# Patient Record
Sex: Male | Born: 1995 | Race: Black or African American | Hispanic: No | Marital: Single | Smoking: Former smoker
Health system: Southern US, Community
[De-identification: ages and names within clinical notes are randomized; demographics above are authoritative.]

## PROBLEM LIST (undated history)

## (undated) DIAGNOSIS — R011 Cardiac murmur, unspecified: Secondary | ICD-10-CM

## (undated) DIAGNOSIS — J40 Bronchitis, not specified as acute or chronic: Secondary | ICD-10-CM

## (undated) DIAGNOSIS — H5350 Unspecified color vision deficiencies: Secondary | ICD-10-CM

## (undated) DIAGNOSIS — J45909 Unspecified asthma, uncomplicated: Secondary | ICD-10-CM

---
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
## 2016-06-16 ENCOUNTER — Emergency Department (HOSPITAL_COMMUNITY)
Admission: EM | Admit: 2016-06-16 | Discharge: 2016-06-16 | Disposition: A | Discharge status: Home / Self Care | Payer: Medicaid Other | Attending: Emergency Medicine | Admitting: Emergency Medicine

## 2016-06-16 ENCOUNTER — Encounter (HOSPITAL_COMMUNITY): Payer: Self-pay | Admitting: Emergency Medicine

## 2016-06-16 DIAGNOSIS — J45909 Unspecified asthma, uncomplicated: Secondary | ICD-10-CM | POA: Insufficient documentation

## 2016-06-16 DIAGNOSIS — Z79899 Other long term (current) drug therapy: Secondary | ICD-10-CM | POA: Insufficient documentation

## 2016-06-16 DIAGNOSIS — Y929 Unspecified place or not applicable: Secondary | ICD-10-CM | POA: Insufficient documentation

## 2016-06-16 DIAGNOSIS — X500XXA Overexertion from strenuous movement or load, initial encounter: Secondary | ICD-10-CM | POA: Insufficient documentation

## 2016-06-16 DIAGNOSIS — Y939 Activity, unspecified: Secondary | ICD-10-CM | POA: Diagnosis not present

## 2016-06-16 DIAGNOSIS — M545 Low back pain, unspecified: Secondary | ICD-10-CM

## 2016-06-16 DIAGNOSIS — Y990 Civilian activity done for income or pay: Secondary | ICD-10-CM | POA: Insufficient documentation

## 2016-06-16 DIAGNOSIS — F17200 Nicotine dependence, unspecified, uncomplicated: Secondary | ICD-10-CM | POA: Diagnosis not present

## 2016-06-16 NOTE — Discharge Instructions (Signed)

## 2016-06-16 NOTE — ED Provider Notes (Signed)
[facility]-EMERGENCY DEPT Provider Note   CSN: [id number] Arrival date & time: 06/16/16  1307   By signing my name below, I, Allen Jensen, attest that this documentation has been prepared under the direction and in the presence of [facility], [place]. Electronically Signed: Teofilo Jensen, ED Scribe. 06/16/2016. 3:30 PM.   History   Chief Complaint Chief Complaint  Patient presents with  . Back Pain    The history is provided by the patient. No language interpreter was used.   HPI Comments:  Allen Jensen is a 21 y.o. male who presents to the Emergency Department complaining of constant left sided low back pain x 6 days. He states that the pain is dull at rest and is "sharp, stabbing and burning" at times when moving or changing positions. He rates the pain at 8/10, and states that the paid radiates up his spine to the mid back and left flank, and down to the left knee. Pt complains of associated intermittent left anterior thigh numbness. He states his pain began after lifting a light box overhead. 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 Pt reports a previous back injury 6 months ago, and states that his current pain is worse than when he was injured. He has taken hot showers with some relief. Denies bowel/bladder incontinence, weakness, nausea, vomiting, abdominal pain, SOB dysuria, hematuria.   PCP per pt: Dr. Jamal Collin; [address]   Past Medical History:  Diagnosis Date  . Asthma   . Bronchitis   . Color blind   . Heart murmur     There are no active problems to display for this patient.   History reviewed. No pertinent surgical history.     Home Medications    Prior to Admission medications   Medication Sig Start Date End Date Taking? Authorizing Provider  HYDROcodone-acetaminophen (NORCO) 5-325 MG tablet Take 1 tablet by mouth every 6 (six) hours as needed. 11/05/15   Kirichenko, Tatyana, PA-C  ibuprofen (ADVIL,MOTRIN) 600  MG tablet Take 1 tablet (600 mg total) by mouth every 6 (six) hours as needed. 11/05/15   Kirichenko, Tatyana, PA-C  predniSONE (DELTASONE) 20 MG tablet Take 2 tablets (40 mg total) by mouth daily. Patient not taking: Reported on 07/09/2015 03/29/15   Anselm Pancoast, PA-C  ranitidine (ZANTAC) 150 MG tablet Take 1 tablet (150 mg total) by mouth 2 (two) times daily. Patient taking differently: Take 150 mg by mouth daily.  07/09/15   Barrett Henle, PA-C    Family History No family history on file.  Social History Social History  Substance Use Topics  . Smoking status: Current Some Day Smoker  . Smokeless tobacco: Never Used  . Alcohol use No     Allergies   Other   Review of Systems Review of Systems  Respiratory: Negative for shortness of breath.   Cardiovascular: Negative for chest pain.  Gastrointestinal: Negative for abdominal pain, nausea and vomiting.  Genitourinary: Negative for dysuria and hematuria.  Musculoskeletal: Positive for back pain.  Neurological: Positive for numbness. Negative for weakness.     Physical Exam Updated Vital Signs BP 125/65 (BP Location: Left Arm)   Pulse (!) 55   Temp 98.3 F (36.8 C) (Oral)   Resp 16   Ht 6\' 2"  (1.88 m)   Wt 68 kg   SpO2 100%   BMI 19.26 kg/m   Physical Exam  Constitutional: He is oriented to person, place, and time.  He appears well-developed and well-nourished. No distress.  HENT:  Head: Normocephalic and atraumatic.  Eyes: Conjunctivae are normal. Pupils are equal, round, and reactive to light. Right eye exhibits no discharge. Left eye exhibits no discharge. No scleral icterus.  Neck: No JVD present. No tracheal deviation present.  Cardiovascular: Normal rate and intact distal pulses.   2+ radial and DP/PT pulses bl, negative Homan's bl   Pulmonary/Chest: Effort normal.  Abdominal: He exhibits no distension.  Musculoskeletal: Normal range of motion. He exhibits tenderness.  No midline spine TTP. Left  sided lumbar paraspinal muscle tenderness. No deformity, crepitus, or stepoff noted. Normal ROM of BUE and BLE. 5/5 strength of BUE and BLE. Pain with flexion of lumbar spine, not with extension or lateral rotation. Left SIJ restricted.    Neurological: He is alert and oriented to person, place, and time. No cranial nerve deficit or sensory deficit.  Fluent speech, no facial droop, sensation intact globally, normal gait, and patient able to heel walk and toe walk without difficulty.   Skin: Skin is warm and dry. Capillary refill takes less than 2 seconds.  Psychiatric: He has a normal mood and affect. His behavior is normal.  Nursing note and vitals reviewed.    ED Treatments / Results  DIAGNOSTIC STUDIES:  Oxygen Saturation is 98% on RA, normal by my interpretation.    COORDINATION OF CARE:  3:09 PM Discussed treatment plan with pt at bedside and pt agreed to plan.   Labs (all labs ordered are listed, but only abnormal results are displayed) Labs Reviewed - No data to display  EKG  EKG Interpretation None       Radiology No results found.  Procedures Procedures (including critical care time)  Medications Ordered in ED Medications - No data to display   Initial Impression / Assessment and Plan / ED Course  I have reviewed the triage vital signs and the nursing notes.  Pertinent labs & imaging results that were available during my care of the patient were reviewed by me and considered in my medical decision making (see chart for details).     Patient with left-sided low back pain for 6 days. Afebrile, vital signs are stable. No red flags concerning patient's back pain. No s/s of central cord compression or cauda equina. Lower extremities are neurovascularly intact and patient is ambulating without difficulty. Likely myofascial in nature with some component of SI joint dysfunction. Discussed use of ibuprofen, Tylenol, heat, ice, gentle stretching to the affected areas.  Recommend follow-up with primary care for reevaluation. Discussed strict ED return precautions. Pt verbalized understanding of and agreement with plan and is safe for discharge home at this time.    Final Clinical Impressions(s) / ED Diagnoses   Final diagnoses:  Acute left-sided low back pain without sciatica    New Prescriptions Discharge Medication List as of 06/16/2016  3:39 PM    I personally performed the services described in this documentation, which was scribed in my presence. The recorded information has been reviewed and is accurate.     Jeanie SewerFawze, Haeley Fordham A, PA-C 06/18/16 [id number]    Jerelyn ScottLinker, Martha, MD 06/18/16 [phone]

## 2016-06-16 NOTE — ED Notes (Signed)
PT DISCHARGED. INSTRUCTIONS GIVEN. AAOX4. PT IN NO APPARENT DISTRESS OR PAIN. THE OPPORTUNITY TO ASK QUESTIONS WAS PROVIDED. 

## 2016-06-16 NOTE — ED Triage Notes (Signed)
Pt complaint of lower back pain for past week post lifting at work.

## 2017-03-04 ENCOUNTER — Emergency Department (HOSPITAL_COMMUNITY)
Admission: EM | Admit: 2017-03-04 | Discharge: 2017-03-04 | Disposition: A | Discharge status: Home / Self Care | Payer: Medicaid Other | Attending: Emergency Medicine | Admitting: Emergency Medicine

## 2017-03-04 ENCOUNTER — Encounter (HOSPITAL_COMMUNITY): Payer: Self-pay | Admitting: Emergency Medicine

## 2017-03-04 DIAGNOSIS — J020 Streptococcal pharyngitis: Secondary | ICD-10-CM | POA: Diagnosis not present

## 2017-03-04 DIAGNOSIS — J029 Acute pharyngitis, unspecified: Secondary | ICD-10-CM | POA: Diagnosis present

## 2017-03-04 DIAGNOSIS — R0981 Nasal congestion: Secondary | ICD-10-CM | POA: Insufficient documentation

## 2017-03-04 DIAGNOSIS — F17210 Nicotine dependence, cigarettes, uncomplicated: Secondary | ICD-10-CM | POA: Insufficient documentation

## 2017-03-04 DIAGNOSIS — R590 Localized enlarged lymph nodes: Secondary | ICD-10-CM | POA: Insufficient documentation

## 2017-03-04 LAB — MONONUCLEOSIS SCREEN: Mono Screen: NEGATIVE

## 2017-03-04 LAB — RAPID STREP SCREEN (MED CTR MEBANE ONLY): Streptococcus, Group A Screen (Direct): POSITIVE — AB

## 2017-03-04 MED ORDER — DEXAMETHASONE 10 MG/ML FOR PEDIATRIC ORAL USE
10.0000 mg | Freq: Once | INTRAMUSCULAR | Status: AC
  Administered 2017-03-04: 10 mg via ORAL
  Filled 2017-03-04: qty 1

## 2017-03-04 MED ORDER — PENICILLIN G BENZATHINE 1200000 UNIT/2ML IM SUSP
1.2000 10*6.[IU] | Freq: Once | INTRAMUSCULAR | Status: AC
  Administered 2017-03-04: 1.2 10*6.[IU] via INTRAMUSCULAR
  Filled 2017-03-04: qty 2

## 2017-03-04 MED ORDER — LIDOCAINE VISCOUS 2 % MT SOLN: 20.0000 mL | OROMUCOSAL | 0 refills | Status: DC | PRN

## 2017-03-04 MED ORDER — IBUPROFEN 200 MG PO TABS
600.0000 mg | ORAL_TABLET | Freq: Once | ORAL | Status: AC
  Administered 2017-03-04: 600 mg via ORAL
  Filled 2017-03-04: qty 3

## 2017-03-04 NOTE — Discharge Instructions (Signed)
Your strep test was positive.  Have been treated with antibiotics in the ED.  I would continue taking Motrin and Tylenol around-the-clock to help with swelling and pain.  Have also given you a lidocaine mouthwash to help numb your throat.  Follow-up with your primary care doctor or return to the ED if you develop difficulties breathing, difficulty swallowing, worsening symptoms.

## 2017-03-04 NOTE — ED Triage Notes (Signed)
Patient c/o sore throat, ear fullness, and congestion for week or more. Patient reports everyone around him is sick.

## 2017-03-04 NOTE — ED Provider Notes (Signed)
[facility]-EMERGENCY DEPT Provider Note   CSN: [id number] Arrival date & time: 03/04/17  0705     History   Chief Complaint Chief Complaint  Patient presents with  . Sore Throat  . Ear Fullness  . Nasal Congestion    HPI Allen Jensen is a 22 y.o. male.  HPI 22 year old African-American male past medical history significant for color blindness presents to the emergency department today for evaluation of sore throat, ear fullness, congestion.  Patient reports that his symptoms have been ongoing for 1 week.  Patient reports fevers and chills.  Has been trying several over-the-counter medications for his symptoms.  States that there are many sick contacts.  States that it is hard to swallow his spit due to the swelling and pain in his throat.  Patient has not taking for his symptoms prior to arrival today.  Patient reports right greater than left ear fullness.  Denies any associated ear pain.  Patient reports clear rhinorrhea.  Denies any associated productive cough.  Nothing makes his symptoms better or worse. Past Medical History:  Diagnosis Date  . Asthma   . Bronchitis   . Color blind   . Heart murmur     There are no active problems to display for this patient.   History reviewed. No pertinent surgical history.     Home Medications    Prior to Admission medications   Medication Sig Start Date End Date Taking? Authorizing Provider  HYDROcodone-acetaminophen (NORCO) 5-325 MG tablet Take 1 tablet by mouth every 6 (six) hours as needed. 11/05/15   Kirichenko, Tatyana, PA-C  ibuprofen (ADVIL,MOTRIN) 600 MG tablet Take 1 tablet (600 mg total) by mouth every 6 (six) hours as needed. 11/05/15   Kirichenko, Tatyana, PA-C  predniSONE (DELTASONE) 20 MG tablet Take 2 tablets (40 mg total) by mouth daily. Patient not taking: Reported on 07/09/2015 03/29/15   Anselm Pancoast, PA-C  ranitidine (ZANTAC) 150 MG tablet Take 1 tablet (150 mg total) by mouth 2 (two) times  daily. Patient taking differently: Take 150 mg by mouth daily.  07/09/15   Barrett Henle, PA-C    Family History No family history on file.  Social History Social History   Tobacco Use  . Smoking status: Current Some Day Smoker  . Smokeless tobacco: Never Used  Substance Use Topics  . Alcohol use: No  . Drug use: No     Allergies   Other   Review of Systems Review of Systems  Constitutional: Positive for chills and fever.  HENT: Positive for congestion, ear pain, rhinorrhea, sore throat and trouble swallowing. Negative for ear discharge.   Respiratory: Negative for cough.   Gastrointestinal: Negative for vomiting.  Musculoskeletal: Negative for myalgias.  Skin: Negative for rash.  Neurological: Negative for headaches.     Physical Exam Updated Vital Signs BP (!) 144/84 (BP Location: Left Arm)   Pulse 92   Temp 99.1 F (37.3 C) (Oral)   Resp 18   SpO2 100%   Physical Exam  Constitutional: He appears well-developed and well-nourished. No distress.  HENT:  Head: Normocephalic and atraumatic.  Right Ear: External ear normal.  Left Ear: Hearing, tympanic membrane, external ear and ear canal normal.  Nose: Mucosal edema and rhinorrhea present. Right sinus exhibits no maxillary sinus tenderness and no frontal sinus tenderness. Left sinus exhibits no maxillary sinus tenderness and no frontal sinus tenderness.  Mouth/Throat: Uvula is midline and mucous membranes are normal. No trismus in the jaw.  No uvula swelling. Oropharyngeal exudate, posterior oropharyngeal edema and posterior oropharyngeal erythema present. No tonsillar abscesses. Tonsils are 3+ on the right. Tonsils are 3+ on the left. Tonsillar exudate.  Cerumen in right ear however portion of the TM was visualized was unremarkable.  No mastoid tenderness.  Tonsils are enlarged and equal bilaterally.  Uvula is midline.  Patient does have a muffled voice.  Eyes: Right eye exhibits no discharge. Left eye  exhibits no discharge. No scleral icterus.  Neck: Normal range of motion. Neck supple.  Pulmonary/Chest: Effort normal. No respiratory distress.  Musculoskeletal: Normal range of motion.  Lymphadenopathy:    He has cervical adenopathy.  Neurological: He is alert.  Skin: Skin is warm and dry. Capillary refill takes less than 2 seconds. No pallor.  Psychiatric: His behavior is normal. Judgment and thought content normal.  Nursing note and vitals reviewed.    ED Treatments / Results  Labs (all labs ordered are listed, but only abnormal results are displayed) Labs Reviewed  RAPID STREP SCREEN (NOT AT [facility])  MONONUCLEOSIS SCREEN    EKG  EKG Interpretation None       Radiology No results found.  Procedures Procedures (including critical care time)  Medications Ordered in ED Medications - No data to display   Initial Impression / Assessment and Plan / ED Course  I have reviewed the triage vital signs and the nursing notes.  Pertinent labs & imaging results that were available during my care of the patient were reviewed by me and considered in my medical decision making (see chart for details).     Pt febrile with tonsillar exudate, cervical lymphadenopathy, & dysphagia; diagnosis of strep with positive strep test.  I did order a mono screen given the appearance of tonsils however low likely given positive strep.. Treated in the Ed with steroids, NSAIDs, Pain medication and PCN IM.  Pt appears mildly dehydrated, discussed importance of water rehydration. Presentation non concerning for PTA or infxn spread to soft tissue. No trismus or uvula deviation. Specific return precautions discussed. Pt able to drink water in ED without difficulty with intact air way. Recommended PCP follow up.    Final Clinical Impressions(s) / ED Diagnoses   Final diagnoses:  Strep pharyngitis    ED Discharge Orders    None       Wallace KellerLeaphart, Keavon Sensing T, PA-C 03/04/17 [id number]    Alvira MondaySchlossman,  Erin, MD 03/04/17 [phone]

## 2017-04-07 ENCOUNTER — Encounter (HOSPITAL_COMMUNITY): Payer: Self-pay | Admitting: Emergency Medicine

## 2017-04-07 ENCOUNTER — Emergency Department (HOSPITAL_COMMUNITY)
Admission: EM | Admit: 2017-04-07 | Discharge: 2017-04-07 | Disposition: A | Discharge status: Home / Self Care | Payer: Medicaid Other | Attending: Emergency Medicine | Admitting: Emergency Medicine

## 2017-04-07 DIAGNOSIS — Z79899 Other long term (current) drug therapy: Secondary | ICD-10-CM | POA: Diagnosis not present

## 2017-04-07 DIAGNOSIS — J45909 Unspecified asthma, uncomplicated: Secondary | ICD-10-CM | POA: Diagnosis not present

## 2017-04-07 DIAGNOSIS — H9202 Otalgia, left ear: Secondary | ICD-10-CM | POA: Diagnosis not present

## 2017-04-07 DIAGNOSIS — J351 Hypertrophy of tonsils: Secondary | ICD-10-CM | POA: Diagnosis not present

## 2017-04-07 DIAGNOSIS — J029 Acute pharyngitis, unspecified: Secondary | ICD-10-CM | POA: Insufficient documentation

## 2017-04-07 DIAGNOSIS — F17200 Nicotine dependence, unspecified, uncomplicated: Secondary | ICD-10-CM | POA: Insufficient documentation

## 2017-04-07 DIAGNOSIS — R070 Pain in throat: Secondary | ICD-10-CM | POA: Diagnosis present

## 2017-04-07 LAB — I-STAT CHEM 8, ED
BUN: 11 mg/dL (ref 6–20)
Calcium, Ion: 1.23 mmol/L (ref 1.15–1.40)
Chloride: 99 mmol/L — ABNORMAL LOW (ref 101–111)
Creatinine, Ser: 0.9 mg/dL (ref 0.61–1.24)
Glucose, Bld: 89 mg/dL (ref 65–99)
HEMATOCRIT: 42 % (ref 39.0–52.0)
HEMOGLOBIN: 14.3 g/dL (ref 13.0–17.0)
POTASSIUM: 3.4 mmol/L — AB (ref 3.5–5.1)
SODIUM: 143 mmol/L (ref 135–145)
TCO2: 25 mmol/L (ref 22–32)

## 2017-04-07 LAB — MONONUCLEOSIS SCREEN: Mono Screen: NEGATIVE

## 2017-04-07 LAB — RAPID STREP SCREEN (MED CTR MEBANE ONLY): STREPTOCOCCUS, GROUP A SCREEN (DIRECT): NEGATIVE

## 2017-04-07 MED ORDER — CLINDAMYCIN HCL 300 MG PO CAPS: 300.0000 mg | ORAL_CAPSULE | Freq: Three times a day (TID) | ORAL | 0 refills | Status: AC

## 2017-04-07 MED ORDER — KETOROLAC TROMETHAMINE 30 MG/ML IJ SOLN
30.0000 mg | Freq: Once | INTRAMUSCULAR | Status: AC
  Administered 2017-04-07: 30 mg via INTRAVENOUS
  Filled 2017-04-07: qty 1

## 2017-04-07 MED ORDER — IBUPROFEN 600 MG PO TABS: 600.0000 mg | ORAL_TABLET | Freq: Four times a day (QID) | ORAL | 0 refills | Status: DC | PRN

## 2017-04-07 MED ORDER — CLINDAMYCIN PHOSPHATE 600 MG/50ML IV SOLN
600.0000 mg | Freq: Once | INTRAVENOUS | Status: AC
  Administered 2017-04-07: 600 mg via INTRAVENOUS
  Filled 2017-04-07: qty 50

## 2017-04-07 MED ORDER — DEXAMETHASONE SODIUM PHOSPHATE 10 MG/ML IJ SOLN
10.0000 mg | Freq: Once | INTRAMUSCULAR | Status: AC
  Administered 2017-04-07: 10 mg via INTRAVENOUS
  Filled 2017-04-07: qty 1

## 2017-04-07 MED ORDER — SODIUM CHLORIDE 0.9 % IV BOLUS (SEPSIS)
1000.0000 mL | Freq: Once | INTRAVENOUS | Status: AC
  Administered 2017-04-07: 1000 mL via INTRAVENOUS

## 2017-04-07 NOTE — ED Triage Notes (Signed)
Patient c/o bilat ear pain and sore throat for 3 days. Been taking ibuprofen but not helping for pain anymore and had trouble swallowing pill this morning.

## 2017-04-07 NOTE — ED Notes (Signed)
Ear pain, throat pain

## 2017-04-07 NOTE — ED Notes (Signed)
Report given to RN

## 2017-04-07 NOTE — ED Provider Notes (Signed)
[facility]-EMERGENCY DEPT Provider Note   CSN: [id number] Arrival date & time: 04/07/17  0903     History   Chief Complaint Chief Complaint  Patient presents with  . Sore Throat  . Otalgia    HPI Allen Jensen is a 22 y.o. male.  HPI   Patient is a 22 y.o. male with a history of asthma (as a child), heart murmur, presenting for sore throat.  Patient reports that he has had symptoms for 2 days.  Patient reports subjective chills and rigors.  Patient reports he has had difficulty swallowing due to the pain.  Patient reports he has subjective swelling greater on the left side of the throat than the right.  Patient denies any sublingual induration, neck tenderness, difficulty breathing, stridor, or inability to tolerate his own secretions.  Patient also reports left ear pain without drainage.  Patient reports feeling of fullness in the left ear.  Patient reports he has been taking ibuprofen without full relief.  Patient denies any history of peritonsillar abscess.  Past Medical History:  Diagnosis Date  . Asthma   . Bronchitis   . Color blind   . Heart murmur     There are no active problems to display for this patient.   History reviewed. No pertinent surgical history.     Home Medications    Prior to Admission medications   Medication Sig Start Date End Date Taking? Authorizing Provider  ibuprofen (ADVIL,MOTRIN) 200 MG tablet Take 200 mg by mouth every 2 (two) hours as needed (THROAT PAIN).   Yes [provider]  lidocaine (XYLOCAINE) 2 % solution Use as directed 20 mLs in the mouth or throat as needed for mouth pain. 03/04/17  Yes Leaphart, Lynann Beaver, PA-C  HYDROcodone-acetaminophen (NORCO) 5-325 MG tablet Take 1 tablet by mouth every 6 (six) hours as needed. Patient not taking: Reported on 04/07/2017 11/05/15   Jaynie Crumble, PA-C  ibuprofen (ADVIL,MOTRIN) 600 MG tablet Take 1 tablet (600 mg total) by mouth every 6 (six) hours as  needed. Patient not taking: Reported on 04/07/2017 11/05/15   Jaynie Crumble, PA-C  predniSONE (DELTASONE) 20 MG tablet Take 2 tablets (40 mg total) by mouth daily. Patient not taking: Reported on 04/07/2017 03/29/15   Anselm Pancoast, PA-C  ranitidine (ZANTAC) 150 MG tablet Take 1 tablet (150 mg total) by mouth 2 (two) times daily. Patient not taking: Reported on 04/07/2017 07/09/15   Barrett Henle, PA-C    Family History No family history on file.  Social History Social History   Tobacco Use  . Smoking status: Current Some Day Smoker  . Smokeless tobacco: Never Used  Substance Use Topics  . Alcohol use: No  . Drug use: No     Allergies   Other   Review of Systems Review of Systems  Constitutional: Positive for chills. Negative for fever.  HENT: Positive for congestion, ear pain and sore throat. Negative for ear discharge and rhinorrhea.   Respiratory: Positive for cough. Negative for shortness of breath.   Gastrointestinal: Negative for abdominal pain, nausea and vomiting.  Genitourinary: Negative for dysuria.  Skin: Negative for rash.     Physical Exam Updated Vital Signs BP 129/80   Pulse 85   Temp 99.2 F (37.3 C) (Oral)   Resp 16   Ht 6\' 1"  (1.854 m)   Wt 72.6 kg (160 lb)   SpO2 99%   BMI 21.11 kg/m   Physical Exam  Constitutional: He appears  well-developed and well-nourished. No distress.  Sitting comfortably in bed.  HENT:  Head: Normocephalic and atraumatic.  Mouth/Throat: Uvula is midline. Tonsils are 3+ on the right. Tonsils are 3+ on the left.  Normal phonation. No muffled voice sounds. Patient swallows secretions without difficulty. Dentition normal. No lesions of tongue or buccal mucosa. Uvula midline. Tonsils are 3+ bilaterally. Slight increased swelling of left tonsil compared ot right.Erythema of posterior pharynx. No tonsillar exuduate. No lingual swelling. No induration inferior to tongue. No submandibular tenderness, swelling, or  induration.  Tissues of the neck supple. Posterior cervical lymphadenopathy. TMs is difficult to visualize due to cerumen impaction.  Eyes: Conjunctivae and EOM are normal. Pupils are equal, round, and reactive to light. Right eye exhibits no discharge. Left eye exhibits no discharge.  EOMs normal to gross examination.  Neck: Normal range of motion. Neck supple.  Cardiovascular: Normal rate, regular rhythm, S1 normal and S2 normal.  No murmur heard. Pulmonary/Chest: Effort normal and breath sounds normal. He has no wheezes. He has no rales.  Normal respiratory effort. Patient converses comfortably. No audible wheeze or stridor.  Abdominal: Soft. He exhibits no distension. There is no tenderness. There is no guarding.  Musculoskeletal: Normal range of motion. He exhibits no edema or deformity.  Lymphadenopathy:    He has no cervical adenopathy.  Neurological: He is alert.  Cranial nerves intact to gross observation. Patient moves extremities without difficulty.  Skin: Skin is warm and dry. No rash noted. He is not diaphoretic. No erythema.  Psychiatric: He has a normal mood and affect. His behavior is normal. Judgment and thought content normal.  Nursing note and vitals reviewed.    ED Treatments / Results  Labs (all labs ordered are listed, but only abnormal results are displayed) Labs Reviewed  RAPID STREP SCREEN (NOT AT [facility])  CULTURE, GROUP A STREP [facility])  MONONUCLEOSIS SCREEN  I-STAT CHEM 8, ED    EKG  EKG Interpretation None       Radiology No results found.  Procedures Procedures (including critical care time)  Medications Ordered in ED Medications  sodium chloride 0.9 % bolus 1,000 mL (not administered)  clindamycin (CLEOCIN) IVPB 600 mg (not administered)  dexamethasone (DECADRON) injection 10 mg (not administered)  ketorolac (TORADOL) 30 MG/ML injection 30 mg (not administered)     Initial Impression / Assessment and Plan / ED Course  I have reviewed  the triage vital signs and the nursing notes.  Pertinent labs & imaging results that were available during my care of the patient were reviewed by me and considered in my medical decision making (see chart for details).  Clinical Course as of Apr 07 1516  Tue Apr 07, 2017  1440 On reevaluation, patient continued to handle secretions and requesting discharge.  [AM]    Clinical Course User Index [AM] Elisha PonderMurray, Katieann Hungate B, PA-C    Patient is nontoxic-appearing, handling his own secretions, and tolerating p.o.  There is slight fullness of the left tonsil and swelling of the anterior tonsillar pillar.  Concern is for early peritonsillar abscess.  Patient has a negative strep test.  Negative Monospot test.  Will initiate IV clindamycin, fluid bolus, Decadron, and Toradol.  Will discuss case with ENT to establish close follow-up for patient.  Pain improved with Toradol and Decadron on multiple reevaluations.  Patient tolerating p.o. challenge, and tolerating solids in the emergency department.  3:26 PM Spoke with Dr. Jenne PaneBates of ENT, who recommends close follow up within 3 days. Environmental health practitionerAdministrative assistant schedule  appt for 3pm on Friday, 04/10/17.  Patient given return precautions for any interim swelling of the tonsils, uvula deviation, difficulty breathing, difficulty swallowing, stridor, or change in voice quality.  Patient is in understanding and agrees with the plan of care.  This is a shared visit with Ankit Nanvati. Patient was independently evaluated by this attending physician. Attending physician consulted in evaluation and discharge management.   Final Clinical Impressions(s) / ED Diagnoses   Final diagnoses:  Sore throat  Swelling of tonsil    ED Discharge Orders    None       Delia Chimes 04/07/17 1658    Derwood Kaplan, MD 04/08/17 [phone]

## 2017-04-07 NOTE — Discharge Instructions (Signed)
Please read and follow all provided instructions.  Your diagnoses today include:  1. Sore throat   2. Swelling of tonsil    Your exam shows that she may have an early fluid pocket behind your tonsil.  You will need to take the antibiotic we gave you, and follow-up with Dr. Jenne PaneBates of ENT.  Tests performed today include: Strep test: was negative for strep throat Strep culture: you will be notified if this comes back positive Vital signs. See below for your results today.   Medications prescribed:    Home care instructions:  Please read the educational materials provided and follow any instructions contained in this packet.  Follow-up instructions: Please follow-up with your primary care provider as needed for further evaluation of your symptoms.  Return instructions:  Please return to the Emergency Department if you experience worsening symptoms.  Return if you have worsening problems swallowing, your neck becomes swollen, you cannot swallow your saliva or your voice becomes muffled.  Return with high persistent fever, persistent vomiting, or if you have trouble breathing.  Please return if you have any other emergent concerns.  Additional Information:  Your vital signs today were: BP 122/66 (BP Location: Left Arm)    Pulse 80    Temp 99.5 F (37.5 C) (Oral)    Resp 15    Ht 6\' 1"  (1.854 m)    Wt 72.6 kg (160 lb)    SpO2 99%    BMI 21.11 kg/m  If your blood pressure (BP) was elevated above 135/85 this visit, please have this repeated by your doctor within one month. --------------

## 2017-04-09 LAB — CULTURE, GROUP A STREP (THRC)

## 2017-05-01 ENCOUNTER — Other Ambulatory Visit: Payer: Self-pay

## 2017-05-01 ENCOUNTER — Emergency Department (HOSPITAL_COMMUNITY)
Admission: EM | Admit: 2017-05-01 | Discharge: 2017-05-01 | Disposition: A | Discharge status: Home / Self Care | Payer: Medicaid Other | Attending: Emergency Medicine | Admitting: Emergency Medicine

## 2017-05-01 ENCOUNTER — Encounter (HOSPITAL_COMMUNITY): Payer: Self-pay

## 2017-05-01 DIAGNOSIS — J0391 Acute recurrent tonsillitis, unspecified: Secondary | ICD-10-CM | POA: Insufficient documentation

## 2017-05-01 DIAGNOSIS — H9202 Otalgia, left ear: Secondary | ICD-10-CM | POA: Diagnosis present

## 2017-05-01 DIAGNOSIS — J039 Acute tonsillitis, unspecified: Secondary | ICD-10-CM

## 2017-05-01 DIAGNOSIS — R590 Localized enlarged lymph nodes: Secondary | ICD-10-CM | POA: Insufficient documentation

## 2017-05-01 DIAGNOSIS — F17210 Nicotine dependence, cigarettes, uncomplicated: Secondary | ICD-10-CM | POA: Diagnosis not present

## 2017-05-01 MED ORDER — DEXAMETHASONE 4 MG PO TABS
10.0000 mg | ORAL_TABLET | Freq: Once | ORAL | Status: AC
  Administered 2017-05-01: 15:00:00 10 mg via ORAL
  Filled 2017-05-01: qty 2

## 2017-05-01 MED ORDER — CLINDAMYCIN HCL 300 MG PO CAPS: 300.0000 mg | ORAL_CAPSULE | Freq: Three times a day (TID) | ORAL | 0 refills | Status: DC

## 2017-05-01 MED ORDER — PREDNISONE 10 MG (21) PO TBPK: ORAL_TABLET | ORAL | 0 refills | Status: DC

## 2017-05-01 MED ORDER — CLINDAMYCIN HCL 300 MG PO CAPS
300.0000 mg | ORAL_CAPSULE | Freq: Once | ORAL | Status: AC
  Administered 2017-05-01: 300 mg via ORAL
  Filled 2017-05-01: qty 1

## 2017-05-01 NOTE — ED Provider Notes (Signed)
[facility]-EMERGENCY DEPT Provider Note   CSN: [id number] Arrival date & time: 05/01/17  1249     History   Chief Complaint Chief Complaint  Patient presents with  . Otalgia    HPI Allen Jensen is a 22 y.o. male with hx of asthma and heart murmer here today with sore throat and ear pain. Patient was evaluated in the ED 04/08/17 with sore throat and possible early tonsillar abscess. Patient was treated with IV antibiotics and f/u with ENT 3 days later. Patient did not f/u with ENT because he felt like he was a lot better. Patient finished his antibiotics 2 weeks ago and about 5 days ago started having left ear pain and left side of throat hurting. Patient denies fever or chills or any other problems.   HPI  Past Medical History:  Diagnosis Date  . Asthma   . Bronchitis   . Color blind   . Heart murmur     There are no active problems to display for this patient.   History reviewed. No pertinent surgical history.      Home Medications    Prior to Admission medications   Medication Sig Start Date End Date Taking? Authorizing Provider  ibuprofen (ADVIL,MOTRIN) 600 MG tablet Take 1 tablet (600 mg total) by mouth every 6 (six) hours as needed. Patient taking differently: Take 600 mg by mouth every 6 (six) hours as needed (pain).  04/07/17  Yes Dayton ScrapeMurray, Alyssa B, PA-C  clindamycin (CLEOCIN) 300 MG capsule Take 1 capsule (300 mg total) by mouth 3 (three) times daily. 05/01/17   Janne NapoleonNeese, Hope M, NP  predniSONE (STERAPRED UNI-PAK 21 TAB) 10 MG (21) TBPK tablet Starting 05/02/17 take 6 tablets PO day one then 5, 4, 3, 2, 1 05/01/17   Janne NapoleonNeese, Hope M, NP    Family History History reviewed. No pertinent family history.  Social History Social History   Tobacco Use  . Smoking status: Current Some Day Smoker  . Smokeless tobacco: Never Used  Substance Use Topics  . Alcohol use: No  . Drug use: No     Allergies   Other   Review of Systems Review of  Systems  Constitutional: Negative for chills and fever.  HENT: Positive for congestion, ear pain, sore throat and trouble swallowing.   Eyes: Negative for discharge, redness and itching.  Respiratory: Negative for shortness of breath and wheezing.   Cardiovascular: Negative for chest pain.  Gastrointestinal: Negative for abdominal pain, diarrhea, nausea and vomiting.  Genitourinary: Negative for dysuria, frequency and urgency.  Musculoskeletal: Negative for neck stiffness.  Skin: Negative for rash.  Neurological: Negative for syncope and headaches.  Psychiatric/Behavioral: Negative for confusion.     Physical Exam Updated Vital Signs BP 115/66 (BP Location: Left Arm)   Pulse 68   Temp 99.5 F (37.5 C) (Oral)   Resp 18   Ht 6\' 1"  (1.854 m)   Wt 72.6 kg (160 lb)   SpO2 99%   BMI 21.11 kg/m   Physical Exam  Constitutional: He appears well-developed and well-nourished. No distress.  HENT:  Head: Normocephalic.  Mouth/Throat: Uvula is midline and mucous membranes are normal. Uvula swelling present. Posterior oropharyngeal erythema present.  Tonsils and enlarged bilateral, no definite abscess. TM's occluded with cerumen.  Eyes: Pupils are equal, round, and reactive to light. Conjunctivae and EOM are normal.  Neck: Normal range of motion. Neck supple.  Cardiovascular: Normal rate.  Pulmonary/Chest: Effort normal.  Musculoskeletal: Normal range of motion.  Lymphadenopathy:    He has cervical adenopathy.  Neurological: He is alert.  Skin: Skin is warm and dry.  Psychiatric: He has a normal mood and affect.  Nursing note and vitals reviewed.    ED Treatments / Results  Labs (all labs ordered are listed, but only abnormal results are displayed) Labs Reviewed - No data to display  EKG None  Radiology No results found.  Procedures Procedures (including critical care time)  Medications Ordered in ED Medications  dexamethasone (DECADRON) tablet 10 mg (has no  administration in time range)     Initial Impression / Assessment and Plan / ED Course  I have reviewed the triage vital signs and the nursing notes. 22 y.o. male with sore throat and swollen tonsils similar to previous visit one month ago stable for d/c with bilateral enlarged tonsils but no abscess. Will treat with Clindamycin and steroids. Discussed with the patient that with the size of his tonsils and re occurance of his symptoms that he should follow up with ENT this time. Patient agrees with plan. Return precautions discussed.   Final Clinical Impressions(s) / ED Diagnoses   Final diagnoses:  Tonsillitis    ED Discharge Orders        Ordered    clindamycin (CLEOCIN) 300 MG capsule  3 times daily     05/01/17 1417    predniSONE (STERAPRED UNI-PAK 21 TAB) 10 MG (21) TBPK tablet     05/01/17 1417       Damian Leavell Crisman, NP 05/01/17 1423    Terrilee Files, MD 05/01/17 1924

## 2017-05-01 NOTE — ED Triage Notes (Addendum)
Patient presents with left ear pain radiating to left facial pain. [facility] same. Patient reports, after taking his prescribed antibiotics,  his right ear has gotten better, but his left ear continues to hurt as well as his throat. Patient reports taking 600mg  Ibuprofen for pain, but is not experiencing relief. Patient reports painful swallowing and speaking, but is able to manage secretions and speak in full sentences. Swelling noted to patient tonsils in triage.

## 2017-05-01 NOTE — Discharge Instructions (Addendum)
Take the medication as directed. Call Dr. Jenne PaneBates' office and schedule a follow up appointment. Return here for worsening symptoms.

## 2017-11-06 ENCOUNTER — Emergency Department (HOSPITAL_COMMUNITY)
Admission: EM | Admit: 2017-11-06 | Discharge: 2017-11-06 | Disposition: A | Discharge status: Home / Self Care | Payer: Medicaid Other | Attending: Emergency Medicine | Admitting: Emergency Medicine

## 2017-11-06 ENCOUNTER — Encounter (HOSPITAL_COMMUNITY): Payer: Self-pay | Admitting: *Deleted

## 2017-11-06 DIAGNOSIS — F17210 Nicotine dependence, cigarettes, uncomplicated: Secondary | ICD-10-CM | POA: Insufficient documentation

## 2017-11-06 DIAGNOSIS — R360 Urethral discharge without blood: Secondary | ICD-10-CM | POA: Diagnosis present

## 2017-11-06 DIAGNOSIS — R369 Urethral discharge, unspecified: Secondary | ICD-10-CM

## 2017-11-06 DIAGNOSIS — R300 Dysuria: Secondary | ICD-10-CM | POA: Insufficient documentation

## 2017-11-06 LAB — URINALYSIS, ROUTINE W REFLEX MICROSCOPIC
BACTERIA UA: NONE SEEN
Bilirubin Urine: NEGATIVE
GLUCOSE, UA: NEGATIVE mg/dL
Hgb urine dipstick: NEGATIVE
KETONES UR: NEGATIVE mg/dL
Nitrite: NEGATIVE
PROTEIN: NEGATIVE mg/dL
Specific Gravity, Urine: 1.027 (ref 1.005–1.030)
pH: 5 (ref 5.0–8.0)

## 2017-11-06 LAB — RAPID HIV SCREEN (HIV 1/2 AB+AG)
HIV 1/2 Antibodies: NONREACTIVE
HIV-1 P24 Antigen - HIV24: NONREACTIVE

## 2017-11-06 MED ORDER — LIDOCAINE HCL 1 % IJ SOLN
INTRAMUSCULAR | Status: AC
Start: 2017-11-06 — End: 2017-11-06
  Administered 2017-11-06: 20 mL
  Filled 2017-11-06: qty 20

## 2017-11-06 MED ORDER — CEFTRIAXONE SODIUM 250 MG IJ SOLR
250.0000 mg | Freq: Once | INTRAMUSCULAR | Status: AC
  Administered 2017-11-06: 250 mg via INTRAMUSCULAR
  Filled 2017-11-06: qty 250

## 2017-11-06 MED ORDER — AZITHROMYCIN 250 MG PO TABS
1000.0000 mg | ORAL_TABLET | Freq: Once | ORAL | Status: AC
  Administered 2017-11-06: 1000 mg via ORAL
  Filled 2017-11-06: qty 4

## 2017-11-06 NOTE — ED Notes (Signed)
Bed: WTR8 Expected date:  Expected time:  Means of arrival:  Comments: 

## 2017-11-06 NOTE — ED Triage Notes (Signed)
Pt complains of burning while urinating and penile discharge for the past 4 days. Pt states the symptoms became worse after a box fell on his groin area. Pt states he last had unprotected sex 4 days ago.

## 2017-11-06 NOTE — ED Provider Notes (Signed)
[name] Provider Note   CSN: [id number] Arrival date & time: 11/06/17  1309     History   Chief Complaint Chief Complaint  Patient presents with  . Penile Discharge  . Dysuria    HPI Allen Jensen is a 22 y.o. male.  HPI   Patient is a 22 year old male with a history of asthma, heart murmur who presents emergency department today for evaluation of dysuria and penile discharge that began yesterday.  Patient states he had unprotected sex 4 days ago.  He denies frequency urgency or hematuria.  No fevers, chills, abdominal pain, nausea or vomiting.  No testicular pain, swelling or erythema.  Past Medical History:  Diagnosis Date  . Asthma   . Bronchitis   . Color blind   . Heart murmur     There are no active problems to display for this patient.   History reviewed. No pertinent surgical history.      Home Medications    Prior to Admission medications   Medication Sig Start Date End Date Taking? Authorizing Provider  clindamycin (CLEOCIN) 300 MG capsule Take 1 capsule (300 mg total) by mouth 3 (three) times daily. 05/01/17   Janne Napoleon, NP  ibuprofen (ADVIL,MOTRIN) 600 MG tablet Take 1 tablet (600 mg total) by mouth every 6 (six) hours as needed. Patient taking differently: Take 600 mg by mouth every 6 (six) hours as needed (pain).  04/07/17   Aviva Kluver B, PA-C  predniSONE (STERAPRED UNI-PAK 21 TAB) 10 MG (21) TBPK tablet Starting 05/02/17 take 6 tablets PO day one then 5, 4, 3, 2, 1 05/01/17   Janne Napoleon, NP    Family History No family history on file.  Social History Social History   Tobacco Use  . Smoking status: Current Some Day Smoker  . Smokeless tobacco: Never Used  Substance Use Topics  . Alcohol use: No  . Drug use: No     Allergies   Other   Review of Systems Review of Systems  Constitutional: Negative for fever.  Respiratory: Negative for shortness of breath.   Cardiovascular: Negative for  chest pain.  Gastrointestinal: Negative for abdominal pain, constipation, diarrhea, nausea and vomiting.  Genitourinary: Positive for discharge and dysuria. Negative for frequency, hematuria, penile swelling, scrotal swelling, testicular pain and urgency.  Musculoskeletal: Negative for back pain.  Neurological: Negative for headaches.   Physical Exam Updated Vital Signs BP (!) 126/58 (BP Location: Left Arm)   Pulse 82   Temp 99.1 F (37.3 C) (Oral)   Resp 18   SpO2 98%   Physical Exam  Constitutional: He is oriented to person, place, and time. He appears well-developed and well-nourished. No distress.  Eyes: Conjunctivae are normal.  Cardiovascular: Normal rate and regular rhythm.  Pulmonary/Chest: Effort normal and breath sounds normal.  Abdominal: Soft. Bowel sounds are normal. He exhibits no distension. There is no tenderness. There is no guarding.  Genitourinary:  Genitourinary Comments: Chaperone present. Normal appearing uncircumcised penis. No obvious testicular swelling or erythema. Penile discharge noted. Gc/chlmaydia obtained.  Neurological: He is alert and oriented to person, place, and time.  Skin: Skin is warm and dry.  Psychiatric:  anxious   ED Treatments / Results  Labs (all labs ordered are listed, but only abnormal results are displayed) Labs Reviewed  URINALYSIS, ROUTINE W REFLEX MICROSCOPIC - Abnormal; Notable for the following components:      Result Value   APPearance HAZY (*)    Leukocytes,  UA LARGE (*)    WBC, UA >50 (*)    All other components within normal limits  URINE CULTURE  RAPID HIV SCREEN (HIV 1/2 AB+AG)  RPR  GC/CHLAMYDIA PROBE AMP ([place]) NOT AT [facility]    EKG None  Radiology No results found.  Procedures Procedures (including critical care time)  Medications Ordered in ED Medications  cefTRIAXone (ROCEPHIN) injection 250 mg (250 mg Intramuscular Given 11/06/17 1410)  azithromycin (ZITHROMAX) tablet 1,000 mg (1,000 mg Oral  Given 11/06/17 1412)  lidocaine (XYLOCAINE) 1 % (with pres) injection (20 mLs  Given 11/06/17 1413)     Initial Impression / Assessment and Plan / ED Course  I have reviewed the triage vital signs and the nursing notes.  Pertinent labs & imaging results that were available during my care of the patient were reviewed by me and considered in my medical decision making (see chart for details).     Final Clinical Impressions(s) / ED Diagnoses   Final diagnoses:  Penile discharge  Dysuria   Patient is afebrile without abdominal tenderness, abdominal pain or painful bowel movements to indicate prostatitis.  No pain of the testes or epididymis to suggest orchitis or epididymitis.  STD cultures obtained including HIV, syphilis, gonorrhea and chlamydia. UA with with leukocytes, RBCs, WBCs, but not bacteria or nitrites. Sterile pyuria suggestive of std rather than UTI. Culture sent. Patient has been treated prophylactically with azithromycin and Rocephin.  Patient to be discharged with instructions to follow up with PCP. Discussed importance of using protection when sexually active. Pt understands that they have GC/Chlamydia cultures pending and that they will need to inform all sexual partners if results return positive.   ED Discharge Orders    None       Rayne Du 11/06/17 1421    Raeford Razor, MD 11/06/17 [id number]

## 2017-11-06 NOTE — Discharge Instructions (Addendum)
You have been tested for HIV, syphilis, chlamydia and gonorrhea.  These results will be available in approximately 3 days and you will be contacted by the hospital if the results are positive. Avoid sexual contact until you are aware of the results, and please inform all sexual partners if you test positive for any of these diseases. ° °A culture was sent of your urine today to determine if there is any bacterial growth. If the results of the culture are positive and you require an antibiotic or a change of your prescribed antibiotic you will be contacted by the hospital. If the results are negative you will not be contacted. ° °Please follow up with your primary care provider within 5-7 days for re-evaluation of your symptoms. If you do not have a primary care provider, information for a healthcare clinic has been provided for you to make arrangements for follow up care. Please return to the emergency department for any new or worsening symptoms. ° °

## 2017-11-06 NOTE — ED Notes (Signed)
HIV nonreactive

## 2017-11-07 LAB — URINE CULTURE: Culture: NO GROWTH

## 2017-11-07 LAB — RPR: RPR: NONREACTIVE

## 2017-11-09 LAB — GC/CHLAMYDIA PROBE AMP (~~LOC~~) NOT AT ARMC
CHLAMYDIA, DNA PROBE: POSITIVE — AB
Neisseria Gonorrhea: POSITIVE — AB

## 2018-01-13 ENCOUNTER — Emergency Department (HOSPITAL_COMMUNITY)
Admission: EM | Admit: 2018-01-13 | Discharge: 2018-01-13 | Disposition: A | Discharge status: Home / Self Care | Payer: Medicaid Other | Attending: Emergency Medicine | Admitting: Emergency Medicine

## 2018-01-13 ENCOUNTER — Encounter (HOSPITAL_COMMUNITY): Payer: Self-pay | Admitting: Emergency Medicine

## 2018-01-13 DIAGNOSIS — F17210 Nicotine dependence, cigarettes, uncomplicated: Secondary | ICD-10-CM | POA: Insufficient documentation

## 2018-01-13 DIAGNOSIS — Z202 Contact with and (suspected) exposure to infections with a predominantly sexual mode of transmission: Secondary | ICD-10-CM | POA: Insufficient documentation

## 2018-01-13 DIAGNOSIS — R519 Headache, unspecified: Secondary | ICD-10-CM

## 2018-01-13 DIAGNOSIS — J45909 Unspecified asthma, uncomplicated: Secondary | ICD-10-CM | POA: Insufficient documentation

## 2018-01-13 DIAGNOSIS — R51 Headache: Secondary | ICD-10-CM | POA: Insufficient documentation

## 2018-01-13 MED ORDER — STERILE WATER FOR INJECTION IJ SOLN
INTRAMUSCULAR | Status: AC
  Filled 2018-01-13: qty 10

## 2018-01-13 MED ORDER — AZITHROMYCIN 250 MG PO TABS
1000.0000 mg | ORAL_TABLET | Freq: Once | ORAL | Status: AC
  Administered 2018-01-13: 1000 mg via ORAL
  Filled 2018-01-13: qty 4

## 2018-01-13 MED ORDER — CEFTRIAXONE SODIUM 250 MG IJ SOLR
250.0000 mg | Freq: Once | INTRAMUSCULAR | Status: AC
  Administered 2018-01-13: 250 mg via INTRAMUSCULAR
  Filled 2018-01-13: qty 250

## 2018-01-13 MED ORDER — DIPHENHYDRAMINE HCL 25 MG PO CAPS
25.0000 mg | ORAL_CAPSULE | Freq: Once | ORAL | Status: AC
  Administered 2018-01-13: 25 mg via ORAL
  Filled 2018-01-13: qty 1

## 2018-01-13 MED ORDER — PROCHLORPERAZINE EDISYLATE 10 MG/2ML IJ SOLN
10.0000 mg | Freq: Once | INTRAMUSCULAR | Status: AC
  Administered 2018-01-13: 10 mg via INTRAMUSCULAR
  Filled 2018-01-13: qty 2

## 2018-01-13 NOTE — ED Provider Notes (Signed)
[facility]-EMERGENCY DEPT Provider Note   CSN: [id number] Arrival date & time: 01/13/18  0708     History   Chief Complaint Chief Complaint  Patient presents with  . Headache  . Exposure to STD    HPI Allen Jensen is a 22 y.o. male with a reported history of migraines who presents emergency department today for headache and concerns for STDs.  Patient reports for the last 4 days he has had a migraine that has been on and off.  He reports that the headache comes on gradually, is left-sided, throbbing in nature, associated photophobia, photophobia, nausea and emesis.  He reports this is a typical migraine for him.  He is tried Tylenol for symptoms without any relief.  He does usually a dark room and rest makes his headache go away.  He denies any fever, chills, neck pain, rash, vertigo, visual changes, diplopia, facial droop, numbness or tingling of the face or extremities, focal weakness.  Patient denies thunderclap onset.  No one with similar headache to suggest CO poisoning.  Patient also reports that his girlfriend recently told him that she was tested positive for chlamydia.  He is requesting testing and treatment.  He denies other sexual partners.  He denies any current symptoms.  No fever, chills, testicular pain, testicular swelling, rashes/ulcers/lesions, penile discharge, dysuria or painful bowel movements.  HPI  Past Medical History:  Diagnosis Date  . Asthma   . Bronchitis   . Color blind   . Heart murmur     There are no active problems to display for this patient.   History reviewed. No pertinent surgical history.      Home Medications    Prior to Admission medications   Medication Sig Start Date End Date Taking? Authorizing Provider  clindamycin (CLEOCIN) 300 MG capsule Take 1 capsule (300 mg total) by mouth 3 (three) times daily. 05/01/17   Janne Napoleon, NP  ibuprofen (ADVIL,MOTRIN) 600 MG tablet Take 1 tablet (600 mg total) by mouth  every 6 (six) hours as needed. Patient taking differently: Take 600 mg by mouth every 6 (six) hours as needed (pain).  04/07/17   Aviva Kluver B, PA-C  predniSONE (STERAPRED UNI-PAK 21 TAB) 10 MG (21) TBPK tablet Starting 05/02/17 take 6 tablets PO day one then 5, 4, 3, 2, 1 05/01/17   Janne Napoleon, NP    Family History No family history on file.  Social History Social History   Tobacco Use  . Smoking status: Current Some Day Smoker    Types: Cigarettes  . Smokeless tobacco: Never Used  Substance Use Topics  . Alcohol use: No  . Drug use: No     Allergies   Other   Review of Systems Review of Systems  All other systems reviewed and are negative.    Physical Exam Updated Vital Signs BP 119/63 (BP Location: Left Arm)   Pulse (!) 54   Temp 98.6 F (37 C) (Oral)   Resp 16   Ht 6\' 3"  (1.905 m)   Wt 76.4 kg   SpO2 100%   BMI 21.05 kg/m   Physical Exam  Constitutional: He appears well-developed and well-nourished.  HENT:  Head: Normocephalic and atraumatic.  Right Ear: External ear normal.  Left Ear: External ear normal.  Nose: Nose normal.  Mouth/Throat: Uvula is midline, oropharynx is clear and moist and mucous membranes are normal. No tonsillar exudate.  No temporal tenderness.  Temporal artery intact bilaterally.  Eyes:  Pupils are equal, round, and reactive to light. Right eye exhibits no discharge. Left eye exhibits no discharge. No scleral icterus.  Pupils are equal, round and reactive bilaterally.  No vertical or rotary nystagmus.  Neck: Trachea normal. Neck supple. No spinous process tenderness present. No neck rigidity. Normal range of motion present.  No nuchal rigidity or meningismus  Cardiovascular: Normal rate, regular rhythm and intact distal pulses.  No murmur heard. Pulses:      Radial pulses are 2+ on the right side, and 2+ on the left side.       Dorsalis pedis pulses are 2+ on the right side, and 2+ on the left side.       Posterior tibial  pulses are 2+ on the right side, and 2+ on the left side.  No lower extremity swelling or edema. Calves symmetric in size bilaterally.  Pulmonary/Chest: Effort normal and breath sounds normal. He exhibits no tenderness.  Abdominal: Soft. Bowel sounds are normal. There is no tenderness. There is no rebound and no guarding.  Genitourinary: Testes normal and penis normal. Right testis shows no mass, no swelling and no tenderness. Left testis shows no mass, no swelling and no tenderness. Circumcised. No penile erythema or penile tenderness. No discharge found.  Genitourinary Comments: Chaperone present during genital exam. No external genital lesions noted, no bumps on head of penis, specifically no vesicles concerning for herpes or chancre suggestive of syphillis, no pain with palpation, no discharge or urethritis noted, scrotum and testicles w/o erythema or swelling, NTTP   Musculoskeletal: He exhibits no edema.  Lymphadenopathy:    He has no cervical adenopathy.  Neurological: He is alert.  Mental Status:  Alert, oriented, thought content appropriate, able to give a coherent history. Speech fluent without evidence of aphasia. Able to follow 2 step commands without difficulty.  Cranial Nerves:  II:  Peripheral visual fields grossly normal, pupils equal, round, reactive to light III,IV, VI: ptosis not present, extra-ocular motions intact bilaterally  V,VII: smile symmetric, eyebrows raise symmetric, facial light touch sensation equal VIII: hearing grossly normal to voice  X: uvula elevates symmetrically  XI: bilateral shoulder shrug symmetric and strong XII: midline tongue extension without fassiculations Motor:  Normal tone. 5/5 in upper and lower extremities bilaterally including strong and equal grip strength and dorsiflexion/plantar flexion Sensory: Sensation intact to light touch in all extremities. Negative Romberg.  Deep Tendon Reflexes: 2+ and symmetric in the biceps and  patella Cerebellar: normal finger-to-nose with bilateral upper extremities. Normal heel-to -shin balance bilaterally of the lower extremity. No pronator drift.  Gait: normal gait and balance CV: distal pulses palpable throughout   Skin: Skin is warm and dry. No rash noted. Rash is not vesicular. He is not diaphoretic.  Psychiatric: He has a normal mood and affect.  Nursing note and vitals reviewed.    ED Treatments / Results  Labs (all labs ordered are listed, but only abnormal results are displayed) Labs Reviewed  RPR  HIV ANTIBODY (ROUTINE TESTING W REFLEX)  GC/CHLAMYDIA PROBE AMP ([place]) NOT AT [facility]    EKG None  Radiology No results found.  Procedures Procedures (including critical care time)  Medications Ordered in ED Medications  sterile water (preservative free) injection (has no administration in time range)  cefTRIAXone (ROCEPHIN) injection 250 mg (250 mg Intramuscular Given 01/13/18 1244)  azithromycin (ZITHROMAX) tablet 1,000 mg (1,000 mg Oral Given 01/13/18 1242)  prochlorperazine (COMPAZINE) injection 10 mg (10 mg Intramuscular Given 01/13/18 1246)  diphenhydrAMINE (BENADRYL) capsule  25 mg (25 mg Oral Given 01/13/18 1242)     Initial Impression / Assessment and Plan / ED Course  I have reviewed the triage vital signs and the nursing notes.  Pertinent labs & imaging results that were available during my care of the patient were reviewed by me and considered in my medical decision making (see chart for details).     22 y.o. male for HA and exposure to std.   Pt HA treated and improved while in ED.  Presentation is like pts typical HA and non concerning for [facility], ICH, Meningitis, or temporal arteritis. Pt is afebrile with no focal neuro deficits, nuchal rigidity, or change in vision. Pt is to follow up with PCP to discuss prophylactic medication. Pt verbalizes understanding and is agreeable with plan.   Pt with recent exposure to std. Patient is afebrile  without abdominal tenderness, abdominal pain or painful bowel movements to indicate prostatitis.  No tenderness to palpation of the testes or epididymis to suggest orchitis or epididymitis.  STD cultures obtained including HIV, syphilis, gonorrhea and chlamydia. Patient to be discharged with instructions to follow up with PCP. Discussed importance of using protection when sexually active. Pt understands that they have GC/Chlamydia cultures pending and that they will need to inform all sexual partners if results return positive. Patient has been treated prophylactically with azithromycin and Rocephin.   I advised the patient to follow-up with pcp. Specific return precautions discussed. Time was given for all questions to be answered. The patient verbalized understanding and agreement with plan. The patient appears safe for discharge home.  Final Clinical Impressions(s) / ED Diagnoses   Final diagnoses:  Exposure to STD  Bad headache    ED Discharge Orders    None       Princella PellegriniMaczis, Estefano Victory M, PA-C 01/13/18 1357    Vanetta MuldersZackowski, Scott, MD 01/16/18 1910

## 2018-01-13 NOTE — Discharge Instructions (Signed)
Please read and follow all provided instructions.  Today you were cultured for a sexually transmitted disease like chlamydia or gonorrhea. You have elected to be treated at this time as a precaution . Results of your gonorrhea and chlamydia tests are pending and you will be notified if they are positive. Please refrain from sexual activity for 48 hours  It is very important to practice safe sex and use condoms when sexually active. If the test is positive, please refrain from sexual activity for 10 days for the medicine to take effect and notify your sexual partners for the last 6 months as they, too, may want to be tested. The website https://garcia.net/http://www.dontspreadit.com/ can be used to send anonymous text messages or emails to alert sexual contacts.  Because you have had unprotected sex, you may want to consider getting tested for HIV (and Syphilis) as well. If you did not elect to be tested for HIV or Syphilis (blood tests) in the department today, you can follow up at the Health Department for further testing for this. Remember that latex condoms or abstinence are the only way to prevent against STDs or HIV.  If you should develop severe or worsening pain in your abdomen or the pelvis or develop severe fevers, nausea or vomiting that prevent you from taking your medications, return to the emergency department immediately. Otherwise contact your local physician or county health department for a follow up appointment to complete STD testing including HIV and syphilis. I have attached the information for the health department.   Gonorrhea and Chlamydia SYMPTOMS  In males, symptoms include:  -Burning with urination.  -Pain in the testicles.  -Watery mucous-like discharge from the penis.  It can cause longstanding (chronic) pelvic pain after frequent infections.  TREATMENT  -You have elected to be treated at this time as a precaution . Return if you develop an itchy rash, swelling in your mouth or lips, or  difficulty breathing.  -This is a sexually transmitted infection. So you are also at risk for other sexually transmitted diseases, including HIV (AIDS), it is recommended that you get tested. HOME CARE INSTRUCTIONS  Warning: This infection is contagious. Do not have sex until treatment is completed. Follow up at your caregiver's office or the clinic to which you were referred. If your diagnosis (learning what is wrong) is confirmed by culture or some other method, your recent sexual contacts need treatment. Even if they are symptom free or have a negative culture or evaluation, they should be treated.  PREVENTION  Practice safe sex, use condoms, have only one sex partner and be sure your sex partner is not having sex with others.  Ask your caregiver to test you for chlamydia at your regular checkups or sooner if you are having symptoms.  Ask for further information if you are pregnant.  SEEK IMMEDIATE MEDICAL CARE IF:  Please return to the Emergency Department if you do not get better, if you get worse, or new symptoms OR You develop an oral temperature above 102 F (38.9 C), not controlled by medications or lasting more than 2 days.  You develop an increase in pain.  You develop any type of abnormal discharge.  You develop vaginal bleeding and it is not time for your period.  You develop painful intercourse.  Please return if you have any other emergent concerns Additional Information:  Your vital signs today were: BP (!) 133/58 (BP Location: Right Arm)    Pulse 72    Temp 98.6 F (  37 C) (Oral)    Resp 15    Ht 6\' 3"  (1.905 m)    Wt 76.4 kg    SpO2 100%    BMI 21.05 kg/m  If your blood pressure (BP) was elevated above 135/85 this visit, please have this repeated by your doctor within one month. ---------------

## 2018-01-13 NOTE — ED Triage Notes (Signed)
Pt c/o headaches for 4 days.  Also needs treatment for chlamydia due to partner being positive.

## 2018-01-14 LAB — RPR: RPR Ser Ql: NONREACTIVE

## 2018-01-14 LAB — GC/CHLAMYDIA PROBE AMP (~~LOC~~) NOT AT ARMC
Chlamydia: POSITIVE — AB
Neisseria Gonorrhea: NEGATIVE

## 2018-01-14 LAB — HIV ANTIBODY (ROUTINE TESTING W REFLEX): HIV Screen 4th Generation wRfx: NONREACTIVE

## 2018-01-16 ENCOUNTER — Emergency Department (HOSPITAL_COMMUNITY)
Admission: EM | Admit: 2018-01-16 | Discharge: 2018-01-16 | Disposition: A | Discharge status: Home / Self Care | Payer: Self-pay | Attending: Emergency Medicine | Admitting: Emergency Medicine

## 2018-01-16 ENCOUNTER — Emergency Department (HOSPITAL_COMMUNITY): Payer: Self-pay

## 2018-01-16 ENCOUNTER — Encounter (HOSPITAL_COMMUNITY): Payer: Self-pay

## 2018-01-16 DIAGNOSIS — F17210 Nicotine dependence, cigarettes, uncomplicated: Secondary | ICD-10-CM | POA: Insufficient documentation

## 2018-01-16 DIAGNOSIS — R0602 Shortness of breath: Secondary | ICD-10-CM | POA: Insufficient documentation

## 2018-01-16 DIAGNOSIS — J45909 Unspecified asthma, uncomplicated: Secondary | ICD-10-CM | POA: Insufficient documentation

## 2018-01-16 DIAGNOSIS — Z79899 Other long term (current) drug therapy: Secondary | ICD-10-CM | POA: Insufficient documentation

## 2018-01-16 MED ORDER — ALBUTEROL SULFATE HFA 108 (90 BASE) MCG/ACT IN AERS
2.0000 | INHALATION_SPRAY | Freq: Once | RESPIRATORY_TRACT | Status: AC
  Administered 2018-01-16: 2 via RESPIRATORY_TRACT
  Filled 2018-01-16: qty 6.7

## 2018-01-16 MED ORDER — DEXAMETHASONE 4 MG PO TABS
10.0000 mg | ORAL_TABLET | Freq: Once | ORAL | Status: AC
  Administered 2018-01-16: 10 mg via ORAL
  Filled 2018-01-16: qty 2

## 2018-01-16 MED ORDER — IPRATROPIUM-ALBUTEROL 0.5-2.5 (3) MG/3ML IN SOLN
3.0000 mL | Freq: Once | RESPIRATORY_TRACT | Status: AC
  Administered 2018-01-16: 3 mL via RESPIRATORY_TRACT
  Filled 2018-01-16: qty 3

## 2018-01-16 MED ORDER — IPRATROPIUM-ALBUTEROL 0.5-2.5 (3) MG/3ML IN SOLN: 3.0000 mL | Freq: Once | RESPIRATORY_TRACT | Status: DC

## 2018-01-16 NOTE — ED Triage Notes (Addendum)
Pt reports that he was treated for an STI on Wednesday and since the treatment he has had shortness of breath. Breathing regular and unlabored.

## 2018-01-16 NOTE — Discharge Instructions (Addendum)
Your chest xray was reassuring and your workup did not show any significant findings.  You were ambulated in the department without shortness of breath or hypoxia.  Please see attached handouts.  Albuterol inhaler - this medication will help open up your airway. Use inhaler as follows: 1-2 puffs with spacer every 4 hours as needed for wheezing, cough, or shortness of breath.  Follow up with your family doctor/primary care doctor If you develop worsening or new concerning symptoms you can return to the emergency department for re-evaluation.

## 2018-01-16 NOTE — ED Provider Notes (Signed)
[facility]-EMERGENCY DEPT Provider Note   CSN: [id number] Arrival date & time: 01/16/18  1547     History   Chief Complaint Chief Complaint  Patient presents with  . Shortness of Breath    HPI Allen Jensen is a 22 y.o. male with a history of asthma bronchitis who presents emergency department today for shortness of breath.  Patient reports that 2 days ago he began having shortness of breath.  He describes this as chest tightness, shortness of breath and occasional wheezing.  He notes history of similar symptoms in the past.  He notes occasional cough that is infrequent.  He notes also associated nasal congestion.  Patient denies any fever, chills, sore throat, chest trauma, hemoptysis, lower extremity swelling, exogenous testosterone use, history of PE/DVT, recent travel, surgery, history of cancer, immobilization.  Patient has not tried anything for symptoms.  HPI  Past Medical History:  Diagnosis Date  . Asthma   . Bronchitis   . Color blind   . Heart murmur     There are no active problems to display for this patient.   History reviewed. No pertinent surgical history.      Home Medications    Prior to Admission medications   Medication Sig Start Date End Date Taking? Authorizing Provider  clindamycin (CLEOCIN) 300 MG capsule Take 1 capsule (300 mg total) by mouth 3 (three) times daily. Patient not taking: Reported on 01/13/2018 05/01/17   Janne NapoleonNeese, Hope M, NP  ibuprofen (ADVIL,MOTRIN) 600 MG tablet Take 1 tablet (600 mg total) by mouth every 6 (six) hours as needed. Patient not taking: Reported on 01/13/2018 04/07/17   Aviva KluverMurray, Alyssa B, PA-C  predniSONE (STERAPRED UNI-PAK 21 TAB) 10 MG (21) TBPK tablet Starting 05/02/17 take 6 tablets PO day one then 5, 4, 3, 2, 1 Patient not taking: Reported on 01/13/2018 05/01/17   Janne NapoleonNeese, Hope M, NP    Family History History reviewed. No pertinent family history.  Social History Social History   Tobacco Use    . Smoking status: Current Every Day Smoker    Types: Cigarettes  . Smokeless tobacco: Never Used  Substance Use Topics  . Alcohol use: No  . Drug use: No     Allergies   Other   Review of Systems Review of Systems  All other systems reviewed and are negative.    Physical Exam Updated Vital Signs BP 126/71 (BP Location: Left Arm)   Pulse 65   Temp 98 F (36.7 C) (Oral)   Resp 18   Wt 70.3 kg   SpO2 100%   BMI 19.37 kg/m   Physical Exam  Constitutional: He appears well-developed and well-nourished.  HENT:  Head: Normocephalic and atraumatic.  Right Ear: External ear normal.  Left Ear: External ear normal.  Nose: Nose normal.  Mouth/Throat: Uvula is midline, oropharynx is clear and moist and mucous membranes are normal. No tonsillar exudate.  Eyes: Pupils are equal, round, and reactive to light. Right eye exhibits no discharge. Left eye exhibits no discharge. No scleral icterus.  Neck: Trachea normal. Neck supple. No spinous process tenderness present. No neck rigidity. Normal range of motion present.  Cardiovascular: Normal rate, regular rhythm and intact distal pulses.  No murmur heard. Pulses:      Radial pulses are 2+ on the right side, and 2+ on the left side.       Dorsalis pedis pulses are 2+ on the right side, and 2+ on the left side.  Posterior tibial pulses are 2+ on the right side, and 2+ on the left side.  No lower extremity swelling or edema. Calves symmetric in size bilaterally.  Pulmonary/Chest: Effort normal. No accessory muscle usage. No tachypnea. No respiratory distress. He has wheezes (end expiratory). He exhibits no tenderness.  Sating at 100% on room air. No increased work of breathing. No accessory muscle use. Patient is sitting upright, speaking in full sentences without difficulty   Abdominal: Soft. Bowel sounds are normal. There is no tenderness. There is no rebound and no guarding.  Musculoskeletal: He exhibits no edema.   Lymphadenopathy:    He has no cervical adenopathy.  Neurological: He is alert.  Skin: Skin is warm and dry. No rash noted. He is not diaphoretic.  Psychiatric: He has a normal mood and affect.  Nursing note and vitals reviewed.    ED Treatments / Results  Labs (all labs ordered are listed, but only abnormal results are displayed) Labs Reviewed - No data to display  EKG None  Radiology Dg Chest 2 View  Result Date: 01/16/2018 CLINICAL DATA:  Shortness of breath EXAM: CHEST - 2 VIEW COMPARISON:  07/09/2015 FINDINGS: Lungs are clear.  No pleural effusion or pneumothorax. The heart is normal in size. Visualized osseous structures are within normal limits. IMPRESSION: Normal chest radiographs. Electronically Signed   By: Charline Bills M.D.   On: 01/16/2018 18:42    Procedures Procedures (including critical care time)  Medications Ordered in ED Medications  albuterol (PROVENTIL HFA;VENTOLIN HFA) 108 (90 Base) MCG/ACT inhaler 2 puff (2 puffs Inhalation Given 01/16/18 1915)  ipratropium-albuterol (DUONEB) 0.5-2.5 (3) MG/3ML nebulizer solution 3 mL (3 mLs Nebulization Given 01/16/18 1915)  dexamethasone (DECADRON) tablet 10 mg (10 mg Oral Given 01/16/18 1955)     Initial Impression / Assessment and Plan / ED Course  I have reviewed the triage vital signs and the nursing notes.  Pertinent labs & imaging results that were available during my care of the patient were reviewed by me and considered in my medical decision making (see chart for details).     22 y.o. male with a history of asthma presenting for sob, chest tightness and wheezing. Vitals reassuring on presentation. Patient is PERC negative. Do not suspect PE. CXR unremarkable. Patient ambulated in ED with O2 saturations maintained >99%, no current signs of respiratory distress. Lung exam improved after nebulizer treatment. Decadron given in the ED. Pt states they are breathing at baseline. Pt has been instructed to continue  using prescribed medications and to speak with PCP about today's exacerbation. Return precautions discussed.   Final Clinical Impressions(s) / ED Diagnoses   Final diagnoses:  Shortness of breath    ED Discharge Orders    None       Princella Pellegrini 01/16/18 1958    Raeford Razor, MD 01/16/18 2027

## 2018-01-16 NOTE — ED Notes (Signed)
Pt ambulated in the hallway and was able to maintain O2 saturation above 99%.  PA aware.

## 2018-02-23 ENCOUNTER — Encounter (HOSPITAL_COMMUNITY): Payer: Self-pay | Admitting: Emergency Medicine

## 2018-02-23 ENCOUNTER — Emergency Department (HOSPITAL_COMMUNITY)
Admission: EM | Admit: 2018-02-23 | Discharge: 2018-02-23 | Disposition: A | Discharge status: Home / Self Care | Payer: Self-pay | Attending: Emergency Medicine | Admitting: Emergency Medicine

## 2018-02-23 DIAGNOSIS — R42 Dizziness and giddiness: Secondary | ICD-10-CM | POA: Insufficient documentation

## 2018-02-23 DIAGNOSIS — Z5321 Procedure and treatment not carried out due to patient leaving prior to being seen by health care provider: Secondary | ICD-10-CM | POA: Insufficient documentation

## 2018-02-23 NOTE — ED Triage Notes (Signed)
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 Reports when leaves work he feels fine. Reports symptoms returned today when he worked again today and had to leave work early due to the symptoms. Pt adds that today he also had headache while at work.

## 2018-02-23 NOTE — ED Notes (Signed)
Pt called for treatment room, no answer x 3.

## 2018-02-23 NOTE — ED Notes (Signed)
Called pt for recheck of V/S No response x1 

## 2018-02-23 NOTE — ED Notes (Signed)
Pt called for treatment, no answer.

## 2018-03-09 DIAGNOSIS — J45909 Unspecified asthma, uncomplicated: Secondary | ICD-10-CM | POA: Insufficient documentation

## 2018-03-09 DIAGNOSIS — M545 Low back pain: Secondary | ICD-10-CM | POA: Insufficient documentation

## 2018-03-09 DIAGNOSIS — F17210 Nicotine dependence, cigarettes, uncomplicated: Secondary | ICD-10-CM | POA: Insufficient documentation

## 2018-03-10 ENCOUNTER — Encounter (HOSPITAL_COMMUNITY): Payer: Self-pay | Admitting: Family Medicine

## 2018-03-10 ENCOUNTER — Emergency Department (HOSPITAL_COMMUNITY)
Admission: EM | Admit: 2018-03-10 | Discharge: 2018-03-10 | Disposition: A | Discharge status: Home / Self Care | Payer: Self-pay | Attending: Emergency Medicine | Admitting: Emergency Medicine

## 2018-03-10 DIAGNOSIS — S39012A Strain of muscle, fascia and tendon of lower back, initial encounter: Secondary | ICD-10-CM

## 2018-03-10 MED ORDER — IBUPROFEN 800 MG PO TABS
800.0000 mg | ORAL_TABLET | Freq: Once | ORAL | Status: AC
  Administered 2018-03-10: 800 mg via ORAL
  Filled 2018-03-10: qty 1

## 2018-03-10 MED ORDER — IBUPROFEN 800 MG PO TABS: 800.0000 mg | ORAL_TABLET | Freq: Three times a day (TID) | ORAL | 0 refills | Status: DC | PRN

## 2018-03-10 MED ORDER — METHOCARBAMOL 500 MG PO TABS: 500.0000 mg | ORAL_TABLET | Freq: Three times a day (TID) | ORAL | 0 refills | Status: DC | PRN

## 2018-03-10 NOTE — ED Triage Notes (Signed)
Patient is complaining of lower back. Denies any recent injury. Patient is ambulating with no assistance and has a steady gait.

## 2018-03-10 NOTE — ED Provider Notes (Signed)
TIME SEEN: 1:40 AM  CHIEF COMPLAINT: Back pain  HPI: Patient is a 23 year old male with no significant past medical history who presents to the emergency department with lower back pain.  The patient's pain started at work when he bent over to lift up a heavy box.  States when he was lifting a box he felt something tighten in his back.  Did not try any medications prior to arrival.  States he just came straight to the ER.  No numbness, tingling or focal weakness.  No bowel or bladder incontinence.  No fever.  ROS: See HPI Constitutional: no fever  Eyes: no drainage  ENT: no runny nose   Cardiovascular:  no chest pain  Resp: no SOB  GI: no vomiting GU: no dysuria Integumentary: no rash  Allergy: no hives  Musculoskeletal: no leg swelling  Neurological: no slurred speech ROS otherwise negative  PAST MEDICAL HISTORY/PAST SURGICAL HISTORY:  Past Medical History:  Diagnosis Date  . Asthma   . Bronchitis   . Color blind   . Heart murmur     MEDICATIONS:  Prior to Admission medications   Medication Sig Start Date End Date Taking? Authorizing Provider  clindamycin (CLEOCIN) 300 MG capsule Take 1 capsule (300 mg total) by mouth 3 (three) times daily. Patient not taking: Reported on 01/13/2018 05/01/17   Janne Napoleon, NP  ibuprofen (ADVIL,MOTRIN) 600 MG tablet Take 1 tablet (600 mg total) by mouth every 6 (six) hours as needed. Patient not taking: Reported on 01/13/2018 04/07/17   Aviva Kluver B, PA-C  predniSONE (STERAPRED UNI-PAK 21 TAB) 10 MG (21) TBPK tablet Starting 05/02/17 take 6 tablets PO day one then 5, 4, 3, 2, 1 Patient not taking: Reported on 01/13/2018 05/01/17   Janne Napoleon, NP    ALLERGIES:  Allergies  Allergen Reactions  . Other Itching    Cats, dogs and pollen    SOCIAL HISTORY:  Social History   Tobacco Use  . Smoking status: Current Every Day Smoker    Packs/day: 0.10    Types: Cigarettes  . Smokeless tobacco: Never Used  Substance Use Topics  . Alcohol  use: No    FAMILY HISTORY: History reviewed. No pertinent family history.  EXAM: BP 127/74 (BP Location: Left Arm)   Pulse 73   Temp 98.6 F (37 C) (Oral)   Resp 18   Ht  (1.88 m)   Wt 70.8 kg   SpO2 100%   BMI 20.03 kg/m  CONSTITUTIONAL: Alert and oriented and responds appropriately to questions. Well-appearing; well-nourished HEAD: Normocephalic EYES: Conjunctivae clear, pupils appear equal, EOMI ENT: normal nose; moist mucous membranes NECK: Supple, no meningismus, no nuchal rigidity, no LAD  CARD: RRR; S1 and S2 appreciated; no murmurs, no clicks, no rubs, no gallops RESP: Normal chest excursion without splinting or tachypnea; breath sounds clear and equal bilaterally; no wheezes, no rhonchi, no rales, no hypoxia or respiratory distress, speaking full sentences ABD/GI: Normal bowel sounds; non-distended; soft, non-tender, no rebound, no guarding, no peritoneal signs, no hepatosplenomegaly BACK:  The back appears normal and is tender over the lumbar paraspinal muscles bilaterally but worse on the left side with associated muscle spasm.  No midline spinal tenderness or step-off or deformity.  No redness, warmth, ecchymosis or swelling noted. EXT: Normal ROM in all joints; non-tender to palpation; no edema; normal capillary refill; no cyanosis, no calf tenderness or swelling    SKIN: Normal color for age and race; warm; no rash NEURO: Moves  all extremities equally, normal sensation diffusely, able to ambulate without difficulty, no saddle anesthesia PSYCH: The patient's mood and manner are appropriate. Grooming and personal hygiene are appropriate.  MEDICAL DECISION MAKING: Patient here with back pain.  Suspect muscle spasm, lumbar sacral strain.  No red flag symptoms to suggest cauda equina, epidural abscess or hematoma, discitis or osteomyelitis, transverse myelitis.  I do not feel he needs emergent imaging of his back.  Doubt fracture.  I recommended alternating Tylenol and  Motrin, alternating heat and ice.  Will discharge with Robaxin.  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  Discussed return precautions.  Provided with outpatient follow-up.   At this time, I do not feel there is any life-threatening condition present. I have reviewed and discussed all results (EKG, imaging, lab, urine as appropriate) and exam findings with patient/family. I have reviewed nursing notes and appropriate previous records.  I feel the patient is safe to be discharged home without further emergent workup and can continue workup as an outpatient as needed. Discussed usual and customary return precautions. Patient/family verbalize understanding and are comfortable with this plan.  Outpatient follow-up has been provided as needed. All questions have been answered.      Natali Lavallee, Layla Maw, DO 03/10/18 [phone]

## 2018-03-10 NOTE — Discharge Instructions (Signed)
You may alternate Tylenol 1000 mg every 6 hours as needed for pain and Ibuprofen 800 mg every 8 hours as needed for pain.  Please take Ibuprofen with food.  Both of these medications are found over-the-counter.

## 2018-05-30 ENCOUNTER — Encounter (HOSPITAL_COMMUNITY): Payer: Self-pay | Admitting: *Deleted

## 2018-05-30 ENCOUNTER — Emergency Department (HOSPITAL_COMMUNITY)
Admission: EM | Admit: 2018-05-30 | Discharge: 2018-05-30 | Disposition: A | Discharge status: Home / Self Care | Payer: Self-pay | Attending: Emergency Medicine | Admitting: Emergency Medicine

## 2018-05-30 ENCOUNTER — Other Ambulatory Visit: Payer: Self-pay

## 2018-05-30 DIAGNOSIS — J45909 Unspecified asthma, uncomplicated: Secondary | ICD-10-CM | POA: Insufficient documentation

## 2018-05-30 DIAGNOSIS — J039 Acute tonsillitis, unspecified: Secondary | ICD-10-CM | POA: Insufficient documentation

## 2018-05-30 DIAGNOSIS — R55 Syncope and collapse: Secondary | ICD-10-CM | POA: Insufficient documentation

## 2018-05-30 DIAGNOSIS — F17210 Nicotine dependence, cigarettes, uncomplicated: Secondary | ICD-10-CM | POA: Insufficient documentation

## 2018-05-30 MED ORDER — HYDROCODONE-ACETAMINOPHEN 5-325 MG PO TABS: 1.0000 | ORAL_TABLET | Freq: Four times a day (QID) | ORAL | 0 refills | Status: DC | PRN

## 2018-05-30 MED ORDER — DEXAMETHASONE 1 MG/ML PO CONC
6.0000 mg | Freq: Once | ORAL | Status: DC
  Filled 2018-05-30: qty 6

## 2018-05-30 MED ORDER — PENICILLIN G BENZATHINE 1200000 UNIT/2ML IM SUSP
1.2000 10*6.[IU] | Freq: Once | INTRAMUSCULAR | Status: AC
  Administered 2018-05-30: 1.2 10*6.[IU] via INTRAMUSCULAR
  Filled 2018-05-30: qty 2

## 2018-05-30 MED ORDER — DEXAMETHASONE 10 MG/ML FOR PEDIATRIC ORAL USE
6.0000 mg | Freq: Once | INTRAMUSCULAR | Status: AC
  Administered 2018-05-30: 15:00:00 6 mg via ORAL
  Filled 2018-05-30: qty 1

## 2018-05-30 NOTE — ED Notes (Signed)
ED Provider at bedside. 

## 2018-05-30 NOTE — ED Triage Notes (Signed)
3 days of throat pain with difficulty swallowing, tonsils are red and swollen. Verbalizes that he has "passed out" a couple of times.

## 2018-05-30 NOTE — ED Provider Notes (Signed)
[facility]-EMERGENCY DEPT Provider Note   CSN: [id number] Arrival date & time: 05/30/18  1331    History   Chief Complaint Chief Complaint  Patient presents with  . Sore Throat    HPI Allen Jensen is a 23 y.o. male.      Sore Throat  Associated symptoms include shortness of breath. Pertinent negatives include no chest pain and no abdominal pain.   Patient presents with sore throat.  Has had nasal drainage and a little bit of cough 2.  Has had for around 3 days.  No fevers.  History of tonsillitis.  States he felt that a couple episodes were he went to lay down and states he woke up later.  Feels as if he passed out.  Has not had any episodes of passing out when he was not lying down.  Mild difficulty breathing.  Still able to swallow.  No definite sick contacts. Past Medical History:  Diagnosis Date  . Asthma   . Bronchitis   . Color blind   . Heart murmur     There are no active problems to display for this patient.   History reviewed. No pertinent surgical history.      Home Medications    Prior to Admission medications   Medication Sig Start Date End Date Taking? Authorizing Provider  HYDROcodone-acetaminophen (NORCO/VICODIN) 5-325 MG tablet Take 1-2 tablets by mouth every 6 (six) hours as needed. 05/30/18   Benjiman Core, MD  ibuprofen (ADVIL,MOTRIN) 800 MG tablet Take 1 tablet (800 mg total) by mouth every 8 (eight) hours as needed for mild pain. 03/10/18   Ward, Layla Maw, DO  methocarbamol (ROBAXIN) 500 MG tablet Take 1 tablet (500 mg total) by mouth every 8 (eight) hours as needed for muscle spasms. 03/10/18   Ward, Layla Maw, DO    Family History No family history on file.  Social History Social History   Tobacco Use  . Smoking status: Current Every Day Smoker    Packs/day: 0.10    Types: Cigarettes  . Smokeless tobacco: Never Used  Substance Use Topics  . Alcohol use: No  . Drug use: No     Allergies   Other    Review of Systems Review of Systems  Constitutional: Positive for appetite change.  HENT: Positive for congestion and sore throat.   Respiratory: Positive for shortness of breath.   Cardiovascular: Negative for chest pain.  Gastrointestinal: Negative for abdominal pain.  Genitourinary: Negative for flank pain.  Musculoskeletal: Negative for back pain.  Neurological: Positive for syncope.  Psychiatric/Behavioral: Negative for confusion.     Physical Exam Updated Vital Signs BP 113/71 (BP Location: Left Arm)   Pulse 88   Temp 100.3 F (37.9 C) (Oral)   Resp 18   Ht 6' 2.5" (1.892 m)   Wt 70.3 kg   SpO2 100%   BMI 19.63 kg/m   Physical Exam Vitals signs and nursing note reviewed.  HENT:     Head: Atraumatic.     Mouth/Throat:     Comments: Bilateral tonsillar swelling.  Nearly kissing.  No exudate.  No stridor. Cardiovascular:     Rate and Rhythm: Normal rate.  Pulmonary:     Breath sounds: No wheezing, rhonchi or rales.  Abdominal:     Palpations: Abdomen is soft.     Tenderness: There is no abdominal tenderness.  Lymphadenopathy:     Cervical: Cervical adenopathy present.  Skin:    General: Skin is warm.  Capillary Refill: Capillary refill takes less than 2 seconds.  Neurological:     Mental Status: He is alert.      ED Treatments / Results  Labs (all labs ordered are listed, but only abnormal results are displayed) Labs Reviewed - No data to display  EKG None  Radiology No results found.  Procedures Procedures (including critical care time)  Medications Ordered in ED Medications  penicillin g benzathine (BICILLIN LA) 1200000 UNIT/2ML injection 1.2 Million Units (has no administration in time range)  dexamethasone (DECADRON) 1 MG/ML solution 6 mg (has no administration in time range)     Initial Impression / Assessment and Plan / ED Course  I have reviewed the triage vital signs and the nursing notes.  Pertinent labs & imaging results that  were available during my care of the patient were reviewed by me and considered in my medical decision making (see chart for details).        Patient with pharyngitis/tonsillitis.  Discussed with patient we will treat with IM penicillin.  Also given steroids for symptomatic relief.  Will give pain control as needed.  Discharge home with ENT follow-up.  Final Clinical Impressions(s) / ED Diagnoses   Final diagnoses:  Tonsillitis    ED Discharge Orders         Ordered    HYDROcodone-acetaminophen (NORCO/VICODIN) 5-325 MG tablet  Every 6 hours PRN     05/30/18 1416           Benjiman CorePickering, Navayah Sok, MD 05/30/18 1416

## 2018-06-23 ENCOUNTER — Other Ambulatory Visit: Payer: Self-pay

## 2018-06-23 ENCOUNTER — Emergency Department (HOSPITAL_COMMUNITY)
Admission: EM | Admit: 2018-06-23 | Discharge: 2018-06-23 | Disposition: A | Discharge status: Home / Self Care | Payer: Self-pay | Attending: Emergency Medicine | Admitting: Emergency Medicine

## 2018-06-23 ENCOUNTER — Encounter (HOSPITAL_COMMUNITY): Payer: Self-pay | Admitting: Obstetrics and Gynecology

## 2018-06-23 DIAGNOSIS — F17210 Nicotine dependence, cigarettes, uncomplicated: Secondary | ICD-10-CM | POA: Insufficient documentation

## 2018-06-23 DIAGNOSIS — H6123 Impacted cerumen, bilateral: Secondary | ICD-10-CM | POA: Insufficient documentation

## 2018-06-23 DIAGNOSIS — Z79899 Other long term (current) drug therapy: Secondary | ICD-10-CM | POA: Insufficient documentation

## 2018-06-23 DIAGNOSIS — J45909 Unspecified asthma, uncomplicated: Secondary | ICD-10-CM | POA: Insufficient documentation

## 2018-06-23 DIAGNOSIS — H66002 Acute suppurative otitis media without spontaneous rupture of ear drum, left ear: Secondary | ICD-10-CM | POA: Insufficient documentation

## 2018-06-23 MED ORDER — DOCUSATE SODIUM 100 MG PO CAPS
100.0000 mg | ORAL_CAPSULE | Freq: Once | ORAL | Status: AC
  Administered 2018-06-23: 100 mg via ORAL
  Filled 2018-06-23: qty 1

## 2018-06-23 MED ORDER — CARBAMIDE PEROXIDE 6.5 % OT SOLN: 5.0000 [drp] | Freq: Two times a day (BID) | OTIC | 0 refills | Status: DC

## 2018-06-23 MED ORDER — AMOXICILLIN 500 MG PO CAPS: 500.0000 mg | ORAL_CAPSULE | Freq: Three times a day (TID) | ORAL | 0 refills | Status: DC

## 2018-06-23 NOTE — ED Triage Notes (Signed)
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 PT reports he has tried home remedies.

## 2018-06-23 NOTE — ED Notes (Signed)
Irrigated left ear with 1/2 strength H2O2 and saline. Yellow colored liquid from the ear with a tiny speck of ear wax.

## 2018-06-23 NOTE — ED Notes (Signed)
Right ear irrigated with 1/2 strength H2O2 and saline. Return of yellow colored liquid.

## 2018-06-23 NOTE — TOC Initial Note (Signed)
Transition of Care [facility]) - Initial/Assessment Note    Patient Details  Name: Allen Jensen MRN: [id number] Date of Birth: 08-22-1995  Transition of Care [facility]) CM/SW Contact:    Elliot Cousin, RN Phone Number: 06/23/2018, 1:34 PM  Clinical Narrative:     6 ED visits in past 6 months  Spoke to pt and states he is currently working but does not have insurance or PCP. Requesting note for work. Pt states he can afford his inhalers. Has hx of asthma. Explained the importance of follow up with Primary Care Physician. Scheduled appt with Primary Care at [facility], televisit on 06/30/2018 at 1:30 pm. Explained he cannot keep appt to call and reschedule.         Expected Discharge Plan: Home/Self Care Barriers to Discharge: No Barriers Identified   Patient Goals and CMS Choice        Expected Discharge Plan and Services Expected Discharge Plan: Home/Self Care   Discharge Planning Services: CM Consult, Follow-up appt scheduled, Medication Assistance   Living arrangements for the past 2 months: Apartment                                      Prior Living Arrangements/Services Living arrangements for the past 2 months: Apartment Lives with:: Domestic Partner Patient language and need for interpreter reviewed:: Yes Do you feel safe going back to the place where you live?: Yes      Need for Family Participation in Patient Care: No (Comment) Care giver support system in place?: No (comment)   Criminal Activity/Legal Involvement Pertinent to Current Situation/Hospitalization: No - Comment as needed  Activities of Daily Living      Permission Sought/Granted Permission sought to share information with : Case Manager, PCP Permission granted to share information with : Yes, Verbal Permission Granted              Emotional Assessment Appearance:: Appears stated age Attitude/Demeanor/Rapport: Engaged Affect (typically observed): Accepting Orientation: : Oriented to  Self, Oriented to Place, Oriented to  Time, Oriented to Situation   Psych Involvement: No (comment)  Admission diagnosis:  ear ache There are no active problems to display for this patient.  PCP:  Patient, No Pcp Per Pharmacy:   [id number] - [place], [place] - [address] AT [address] OF [facility] [address] [place] [address] Phone: [phone] Fax: [phone]     Social Determinants of Health (SDOH) Interventions    Readmission Risk Interventions No flowsheet data found.

## 2018-06-23 NOTE — ED Notes (Signed)
Irrigated patient's left ear again and patient states that it is painful/burning. Got a small amount of dark brown earwax.

## 2018-06-23 NOTE — ED Provider Notes (Signed)
[facility]-EMERGENCY DEPT Provider Note   CSN: [id number] Arrival date & time: 06/23/18  1155    History   Chief Complaint Chief Complaint  Patient presents with  . Otalgia    HPI Allen Jensen is a 23 y.o. male.     Pt presents to the ED today with left ear deafness.  Pt said he thinks he's been developing pimples in his ear which have caused him not to hear.  He noticed it in his right ear last week and left ear this week.  No other sx.     Past Medical History:  Diagnosis Date  . Asthma   . Bronchitis   . Color blind   . Heart murmur     There are no active problems to display for this patient.   History reviewed. No pertinent surgical history.      Home Medications    Prior to Admission medications   Medication Sig Start Date End Date Taking? Authorizing Provider  amoxicillin (AMOXIL) 500 MG capsule Take 1 capsule (500 mg total) by mouth 3 (three) times daily. 06/23/18   Jacalyn LefevreHaviland, Matsue Strom, MD  carbamide peroxide (DEBROX) 6.5 % OTIC solution Place 5 drops into both ears 2 (two) times daily. 06/23/18   Jacalyn LefevreHaviland, Tahjae Clausing, MD  HYDROcodone-acetaminophen (NORCO/VICODIN) 5-325 MG tablet Take 1-2 tablets by mouth every 6 (six) hours as needed. 05/30/18   Benjiman CorePickering, Nathan, MD  ibuprofen (ADVIL,MOTRIN) 800 MG tablet Take 1 tablet (800 mg total) by mouth every 8 (eight) hours as needed for mild pain. 03/10/18   Ward, Layla MawKristen N, DO  methocarbamol (ROBAXIN) 500 MG tablet Take 1 tablet (500 mg total) by mouth every 8 (eight) hours as needed for muscle spasms. 03/10/18   Ward, Layla MawKristen N, DO    Family History No family history on file.  Social History Social History   Tobacco Use  . Smoking status: Current Every Day Smoker    Packs/day: 0.10    Types: Cigarettes  . Smokeless tobacco: Never Used  Substance Use Topics  . Alcohol use: No  . Drug use: Yes    Types: Marijuana     Allergies   Other   Review of Systems Review of Systems  HENT:  Positive for ear pain and hearing loss.   All other systems reviewed and are negative.    Physical Exam Updated Vital Signs BP 121/61 (BP Location: Right Arm)   Pulse (!) 56   Temp 98.6 F (37 C) (Oral)   Resp 16   SpO2 100%   Physical Exam Vitals signs and nursing note reviewed.  Constitutional:      Appearance: Normal appearance.  HENT:     Head: Normocephalic and atraumatic.     Right Ear: There is impacted cerumen.     Left Ear: There is impacted cerumen.     Nose: Nose normal.     Mouth/Throat:     Mouth: Mucous membranes are moist.     Pharynx: Oropharynx is clear.  Eyes:     Extraocular Movements: Extraocular movements intact.     Conjunctiva/sclera: Conjunctivae normal.     Pupils: Pupils are equal, round, and reactive to light.  Neck:     Musculoskeletal: Normal range of motion and neck supple.  Cardiovascular:     Rate and Rhythm: Normal rate and regular rhythm.     Pulses: Normal pulses.     Heart sounds: Normal heart sounds.  Pulmonary:     Effort: Pulmonary effort is  normal.     Breath sounds: Normal breath sounds.  Abdominal:     General: Abdomen is flat. Bowel sounds are normal.     Palpations: Abdomen is soft.  Musculoskeletal: Normal range of motion.  Skin:    General: Skin is warm.     Capillary Refill: Capillary refill takes less than 2 seconds.  Neurological:     General: No focal deficit present.     Mental Status: He is alert and oriented to person, place, and time.  Psychiatric:        Mood and Affect: Mood normal.        Behavior: Behavior normal.      ED Treatments / Results  Labs (all labs ordered are listed, but only abnormal results are displayed) Labs Reviewed - No data to display  EKG None  Radiology No results found.  Procedures Procedures (including critical care time)  Medications Ordered in ED Medications  docusate sodium (COLACE) capsule 100 mg (100 mg Oral Given 06/23/18 1307)  docusate sodium (COLACE) capsule  100 mg (100 mg Oral Given 06/23/18 1306)  docusate sodium (COLACE) capsule 100 mg (100 mg Oral Given 06/23/18 1352)     Initial Impression / Assessment and Plan / ED Course  I have reviewed the triage vital signs and the nursing notes.  Pertinent labs & imaging results that were available during my care of the patient were reviewed by me and considered in my medical decision making (see chart for details).     The pt's nurse irrigated ears after colace was placed and was able to clear the left ear.  After ear wax removal, pt noted to have a bulging left TM.  Pt's right ear is not completely cleared of wax, but that is not painful.  The pt is d/c home with debrox and with amox.    Pt is stable for d/c.  Return if worse.  Final Clinical Impressions(s) / ED Diagnoses   Final diagnoses:  Non-recurrent acute suppurative otitis media of left ear without spontaneous rupture of tympanic membrane  Bilateral impacted cerumen    ED Discharge Orders         Ordered    amoxicillin (AMOXIL) 500 MG capsule  3 times daily     06/23/18 1501    carbamide peroxide (DEBROX) 6.5 % OTIC solution  2 times daily     06/23/18 1501           Jacalyn Lefevre, MD 06/23/18 1503

## 2018-06-23 NOTE — ED Notes (Signed)
Left ear with moderate amount of brown drainage on the cotton ball.

## 2018-06-30 ENCOUNTER — Encounter: Payer: Self-pay | Admitting: Family Medicine

## 2018-06-30 ENCOUNTER — Other Ambulatory Visit: Payer: Self-pay

## 2018-06-30 ENCOUNTER — Ambulatory Visit (INDEPENDENT_AMBULATORY_CARE_PROVIDER_SITE_OTHER): Payer: Self-pay | Admitting: Family Medicine

## 2018-06-30 DIAGNOSIS — H66002 Acute suppurative otitis media without spontaneous rupture of ear drum, left ear: Secondary | ICD-10-CM

## 2018-06-30 MED ORDER — NEOMYCIN-POLYMYXIN-HC 3.5-10000-1 OT SOLN: 4.0000 [drp] | Freq: Four times a day (QID) | OTIC | 0 refills | Status: DC

## 2018-06-30 NOTE — Progress Notes (Signed)
Virtual Visit via Telephone Note  I connected with Allen Jensen on 06/30/18 at  1:30 PM EDT by telephone and verified that I am speaking with the correct person using two identifiers.  Location: Patient: Located in personal automobile during today's encounter  Provider: Located at primary care office   I discussed the limitations, risks, security and privacy concerns of performing an evaluation and management service by telephone and the availability of in person appointments. I also discussed with the patient that there may be a patient responsible charge related to this service. The patient expressed understanding and agreed to proceed.   History of Present Illness: Allen Jensen was referred here by the ER to establish care. He is upset that this initial visit was telephonic. Explained that currently this is our office process for initial visits due to COVID-19. He is present for today's telemedicine encounter due to on-going bilateral ear pain > left ear. During ER visit he was diagnosed with acute suppurative otitis media of the left ear without spontaneous rupture.  He was prescribed amoxicillin 500 mg 3 times daily and prescribed Debrox drops for bilateral impacted cerumen in both ears.  He reports neither medications have helped at all with his symptoms.  He complains of inability to hear ear pain.  He is uninsured and unable to follow-up with an ENT.  Has any history of chronic ongoing ear complaints.  Last fever, congestion, sinus pain, sore throat or any other URI symptoms.  Assessment and Plan: 1. Acute suppurative otitis media of left ear without spontaneous rupture of tympanic membrane, recurrence not specified -Complete current remaining doses of Amoxicillin. -Start Cortisporin drops in left ear 4 times daily x5 days -Follow-up in 1 week for cerumen check and evaluation of left ear.  Patient advised that he is only able to follow-up on 07/09/2018.  Advised that our office will be  closed on that date and gave him the option of following up next-door at [facility].  He was not very satisfied with this option, however provided the contact information for him to call the urgent care and also address if appointment is not necessary.   Meds ordered this encounter  Medications  . neomycin-polymyxin-hydrocortisone (CORTISPORIN) OTIC solution    Sig: Place 4 drops into the left ear 4 (four) times daily.    Dispense:  10 mL    Refill:  0    Follow Up Instructions: RTC PRN    I discussed the assessment and treatment plan with the patient. The patient was provided an opportunity to ask questions and all were answered. The patient agreed with the plan and demonstrated an understanding of the instructions.   The patient was advised to call back or seek an in-person evaluation if the symptoms worsen or if the condition fails to improve as anticipated.  I provided 25 minutes of non-face-to-face time during this encounter.   Joaquin Courts, FNP

## 2018-06-30 NOTE — Progress Notes (Deleted)
Called patient to initiate their telephone visit with provider Joaquin Courts, FNP-C. Verified date of birth. Patient states that the hearing loss is still present in the L ear. Has 1 more day left of the oral antibiotic. Is still using the Debrox. Has noted some discharge from his ears. KWalker, CMA.

## 2018-07-09 ENCOUNTER — Ambulatory Visit
Admission: EM | Admit: 2018-07-09 | Discharge: 2018-07-09 | Disposition: A | Discharge status: Home / Self Care | Payer: Self-pay | Attending: Physician Assistant | Admitting: Physician Assistant

## 2018-07-09 ENCOUNTER — Other Ambulatory Visit: Payer: Self-pay

## 2018-07-09 DIAGNOSIS — H6123 Impacted cerumen, bilateral: Secondary | ICD-10-CM

## 2018-07-09 DIAGNOSIS — H9192 Unspecified hearing loss, left ear: Secondary | ICD-10-CM

## 2018-07-09 MED ORDER — FLUTICASONE PROPIONATE 50 MCG/ACT NA SUSP: 2.0000 | Freq: Every day | NASAL | 0 refills | Status: DC

## 2018-07-09 NOTE — Discharge Instructions (Signed)
Start flonase as directed for possible eustachian tube dysfunction. If no significant improvement 1-2 week switch to nasacort.

## 2018-07-09 NOTE — ED Triage Notes (Signed)
Pt here for follow up from lt ear infection. States the pain is gone but still unable to hear good.

## 2018-07-09 NOTE — ED Provider Notes (Signed)
Allen Jensen    CSN: [id number] Arrival date & time: 07/09/18  1021     History   Chief Complaint Chief Complaint  Patient presents with  . Follow-up    HPI Allen Jensen is a 23 y.o. male.   23 year old male comes in for left ear muffled hearing.  He was first seen at the ED 06/23/2018 for left ear pain, was found to have cerumen impaction.  After ear irrigation, was found to have left otitis media and was given amoxicillin.  At that time, given still with cerumen impaction to the right ear, was given Debrox as well.  He does not had virtual visit with PCP 06/30/2018, and was given Cortisporin eardrops.  He has now finished courses of antibiotics, but still using Debrox without relief.  States pain has not resolved, but continues to have muffled hearing.  He denies rhinorrhea, nasal congestion, cough.  Denies watery eyes/itchy eyes.  Denies fever, chills, body aches, night sweats.     Past Medical History:  Diagnosis Date  . Asthma   . Bronchitis   . Color blind   . Heart murmur     There are no active problems to display for this patient.   History reviewed. No pertinent surgical history.     Home Medications    Prior to Admission medications   Medication Sig Start Date End Date Taking? Authorizing Provider  amoxicillin (AMOXIL) 500 MG capsule Take 1 capsule (500 mg total) by mouth 3 (three) times daily. 06/23/18   Jacalyn LefevreHaviland, Julie, MD  carbamide peroxide (DEBROX) 6.5 % OTIC solution Place 5 drops into both ears 2 (two) times daily. 06/23/18   Jacalyn LefevreHaviland, Julie, MD  fluticasone (FLONASE) 50 MCG/ACT nasal spray Place 2 sprays into both nostrils daily. 07/09/18   Cathie HoopsYu, Tresean Mattix V, PA-C  neomycin-polymyxin-hydrocortisone (CORTISPORIN) OTIC solution Place 4 drops into the left ear 4 (four) times daily. 06/30/18   Bing NeighborsHarris, Kimberly S, FNP    Family History Family History  Family history unknown: Yes    Social History Social History   Tobacco Use  . Smoking status:  Current Every Day Smoker    Packs/day: 0.10    Types: Cigarettes  . Smokeless tobacco: Never Used  . Tobacco comment: 3 cigarettes/day  Substance Use Topics  . Alcohol use: No  . Drug use: Yes    Types: Marijuana     Allergies   Other   Review of Systems Review of Systems  Reason unable to perform ROS: See HPI as above.     Physical Exam Triage Vital Signs ED Triage Vitals  Enc Vitals Group     BP 07/09/18 1027 119/67     Pulse Rate 07/09/18 1027 (!) 57     Resp 07/09/18 1027 12     Temp 07/09/18 1027 97.9 F (36.6 C)     Temp Source 07/09/18 1027 Oral     SpO2 07/09/18 1027 99 %     Weight --      Height --      Head Circumference --      Peak Flow --      Pain Score 07/09/18 1030 0     Pain Loc --      Pain Edu? --      Excl. in GC? --    No data found.  Updated Vital Signs BP 119/67 (BP Location: Right Arm)   Pulse (!) 57   Temp 97.9 F (36.6 C) (Oral)   Resp 12  SpO2 99%    Physical Exam Constitutional:      General: He is not in acute distress.    Appearance: He is well-developed. He is not diaphoretic.  HENT:     Head: Normocephalic and atraumatic.     Ears:     Comments: No tenderness to palpation of bilateral tragus.  Bilateral cerumen impaction, TM not visible.  No obvious ear canal swelling.  After ear irrigation, patient still without ear canal swelling.  Bilateral TM visible, pearly gray, no erythema, bulging, middle ear effusion. Eyes:     Conjunctiva/sclera: Conjunctivae normal.     Pupils: Pupils are equal, round, and reactive to light.  Neurological:     Mental Status: He is alert and oriented to person, place, and time.      UC Treatments / Results  Labs (all labs ordered are listed, but only abnormal results are displayed) Labs Reviewed - No data to display  EKG None  Radiology No results found.  Procedures Procedures (including critical Jensen time)  Medications Ordered in UC Medications - No data to display   Initial Impression / Assessment and Plan / UC Course  I have reviewed the triage vital signs and the nursing notes.  Pertinent labs & imaging results that were available during my Jensen of the patient were reviewed by me and considered in my medical decision making (see chart for details).    Patient with improved symptoms after ear irrigation, but still endorses slight muffled hearing.  TM normal, discussed possible eustachian tube dysfunction causing symptoms although no obvious midear effusion.  Will have patient try Flonase/Nasacort for symptoms.  Return precautions given.  Patient expresses understanding and agrees to plan.  Final Clinical Impressions(s) / UC Diagnoses   Final diagnoses:  Bilateral impacted cerumen  Decreased hearing of left ear    ED Prescriptions    Medication Sig Dispense Auth. Provider   fluticasone (FLONASE) 50 MCG/ACT nasal spray Place 2 sprays into both nostrils daily. 1 g Threasa Alpha, [place] 07/09/18 1148

## 2018-09-21 ENCOUNTER — Encounter (HOSPITAL_COMMUNITY): Payer: Self-pay | Admitting: Emergency Medicine

## 2018-09-21 ENCOUNTER — Emergency Department (HOSPITAL_COMMUNITY)
Admission: EM | Admit: 2018-09-21 | Discharge: 2018-09-21 | Disposition: A | Discharge status: Home / Self Care | Payer: Self-pay | Attending: Emergency Medicine | Admitting: Emergency Medicine

## 2018-09-21 ENCOUNTER — Other Ambulatory Visit: Payer: Self-pay

## 2018-09-21 DIAGNOSIS — Z8709 Personal history of other diseases of the respiratory system: Secondary | ICD-10-CM | POA: Insufficient documentation

## 2018-09-21 DIAGNOSIS — R52 Pain, unspecified: Secondary | ICD-10-CM | POA: Insufficient documentation

## 2018-09-21 DIAGNOSIS — F17210 Nicotine dependence, cigarettes, uncomplicated: Secondary | ICD-10-CM | POA: Insufficient documentation

## 2018-09-21 NOTE — Discharge Instructions (Signed)
Your symptoms are likely consistent with a viral illness. Viruses do not require or respond to antibiotics. Treatment is symptomatic care and it is important to note that these symptoms may last for 7-14 days.   Hand washing: Wash your hands throughout the day, but especially before and after touching the face, using the restroom, sneezing, coughing, or touching surfaces that have been coughed or sneezed upon. Hydration: Symptoms of most illnesses will be intensified and complicated by dehydration. Dehydration can also extend the duration of symptoms. Drink plenty of fluids and get plenty of rest. You should be drinking at least half a liter of water an hour to stay hydrated. Electrolyte drinks (ex. Gatorade, Powerade, Pedialyte) are also encouraged. You should be drinking enough fluids to make your urine light yellow, almost clear. If this is not the case, you are not drinking enough water. Please note that some of the treatments indicated below will not be effective if you are not adequately hydrated. Pain or fever: Ibuprofen, Naproxen, or acetaminophen (generic for Tylenol) for pain or fever.  Antiinflammatory medications: Take 600 mg of ibuprofen every 6 hours or 440 mg (over the counter dose) to 500 mg (prescription dose) of naproxen every 12 hours for the next 3 days. After this time, these medications may be used as needed for pain. Take these medications with food to avoid upset stomach. Choose only one of these medications, do not take them together. Acetaminophen (generic for Tylenol): Should you continue to have additional pain while taking the ibuprofen or naproxen, you may add in acetaminophen as needed. Your daily total maximum amount of acetaminophen from all sources should be limited to 4000mg /day for persons without liver problems, or 2000mg /day for those with liver problems. Zyrtec or Claritin: May add these medication daily to control underlying symptoms of congestion, sneezing, and other  signs of allergies.  These medications are available over-the-counter. Generics: Cetirizine (generic for Zyrtec) and loratadine (generic for Claritin). Fluticasone: Use fluticasone (generic for Flonase), as directed, for nasal and sinus congestion.  This medication is available over-the-counter. Congestion: Plain guaifenesin (generic for plain Mucinex) may help relieve congestion. Saline sinus rinses and saline nasal sprays may also help relieve congestion. If you do not have high blood pressure, heart problems, or an allergy to such medications, you may also try phenylephrine or Sudafed. Sore throat: Warm liquids or Chloraseptic spray may help soothe a sore throat. Gargle twice a day with a salt water solution made from a half teaspoon of salt in a cup of warm water.  Follow up: Follow up with a primary care provider within the next two weeks should symptoms fail to resolve. Return: Return to the ED for significantly worsening symptoms, shortness of breath, persistent vomiting, large amounts of blood in stool, or any other major concerns.  For prescription assistance, may try using prescription discount sites or apps, such as goodrx.com

## 2018-09-21 NOTE — ED Provider Notes (Signed)
[facility]-EMERGENCY DEPT Provider Note   CSN: [id number] Arrival date & time: 09/21/18  1258    History   Chief Complaint Chief Complaint  Patient presents with  . Generalized Body Aches    HPI Allen Jensen is a 23 y.o. male.     HPI   Allen Jensen is a 23 y.o. male, with a history of asthma, bronchitis, heart murmur, presenting to the ED with generalized body aches beginning this morning. There was a day last week when he had a congested cough, but then this resolved.  He intermittently has nasal congestion, but none today. Patient denies contact with known or suspected COVID-19 patients. He denies spending any time in tick infested areas.  No known insect bites. Denies fever/chills, N/V/D, sinus pain, neck pain/stiffness, rash, chest pain, shortness of breath, abdominal pain, urinary symptoms, dizziness/lightheadedness, wounds, or any other complaints.   Past Medical History:  Diagnosis Date  . Asthma   . Bronchitis   . Color blind   . Heart murmur     There are no active problems to display for this patient.   History reviewed. No pertinent surgical history.      Home Medications    Prior to Admission medications   Medication Sig Start Date End Date Taking? Authorizing Provider  amoxicillin (AMOXIL) 500 MG capsule Take 1 capsule (500 mg total) by mouth 3 (three) times daily. 06/23/18   Jacalyn LefevreHaviland, Julie, MD  carbamide peroxide (DEBROX) 6.5 % OTIC solution Place 5 drops into both ears 2 (two) times daily. 06/23/18   Jacalyn LefevreHaviland, Julie, MD  fluticasone (FLONASE) 50 MCG/ACT nasal spray Place 2 sprays into both nostrils daily. 07/09/18   Cathie HoopsYu, Amy V, PA-C  neomycin-polymyxin-hydrocortisone (CORTISPORIN) OTIC solution Place 4 drops into the left ear 4 (four) times daily. 06/30/18   Bing NeighborsHarris, Kimberly S, FNP    Family History Family History  Family history unknown: Yes    Social History Social History   Tobacco Use  . Smoking status: Current  Every Day Smoker    Packs/day: 0.10    Types: Cigarettes  . Smokeless tobacco: Never Used  . Tobacco comment: 3 cigarettes/day  Substance Use Topics  . Alcohol use: No  . Drug use: Yes    Types: Marijuana     Allergies   Other   Review of Systems Review of Systems  Constitutional: Negative for chills, diaphoresis and fever.  HENT: Positive for congestion. Negative for dental problem, ear pain, rhinorrhea, sinus pressure, sinus pain and sore throat.   Eyes: Negative for pain.  Respiratory: Negative for cough, chest tightness and shortness of breath.   Cardiovascular: Negative for chest pain, palpitations and leg swelling.  Gastrointestinal: Negative for abdominal pain, blood in stool, diarrhea, nausea and vomiting.  Genitourinary: Negative for discharge, dysuria, flank pain, frequency, hematuria, scrotal swelling and testicular pain.  Musculoskeletal: Positive for myalgias. Negative for neck pain and neck stiffness.  Neurological: Negative for dizziness, syncope, light-headedness and headaches.  All other systems reviewed and are negative.    Physical Exam Updated Vital Signs BP 125/80 (BP Location: Left Arm)   Pulse 99   Temp 99.1 F (37.3 C) (Oral)   Resp 16   Ht 6\' 2"  (1.88 m)   Wt 71.2 kg   SpO2 99%   BMI 20.16 kg/m   Physical Exam Vitals signs and nursing note reviewed.  Constitutional:      General: He is not in acute distress.    Appearance: He is  well-developed. He is not diaphoretic.  HENT:     Head: Normocephalic and atraumatic.     Nose: Nose normal.     Right Sinus: No maxillary sinus tenderness or frontal sinus tenderness.     Left Sinus: No maxillary sinus tenderness or frontal sinus tenderness.     Mouth/Throat:     Mouth: Mucous membranes are moist.     Pharynx: Oropharynx is clear.  Eyes:     Conjunctiva/sclera: Conjunctivae normal.  Neck:     Musculoskeletal: Normal range of motion and neck supple. No neck rigidity.  Cardiovascular:      Rate and Rhythm: Normal rate and regular rhythm.     Pulses: Normal pulses.          Radial pulses are 2+ on the right side and 2+ on the left side.       Posterior tibial pulses are 2+ on the right side and 2+ on the left side.     Heart sounds: Normal heart sounds.     Comments: Tactile temperature in the extremities appropriate and equal bilaterally. I was unable to discern any abnormalities on auscultation of the heart. Pulmonary:     Effort: Pulmonary effort is normal. No respiratory distress.     Breath sounds: Normal breath sounds.  Abdominal:     Palpations: Abdomen is soft.     Tenderness: There is no abdominal tenderness. There is no guarding.  Musculoskeletal:     Right lower leg: No edema.     Left lower leg: No edema.  Lymphadenopathy:     Cervical: No cervical adenopathy.  Skin:    General: Skin is warm and dry.  Neurological:     Mental Status: He is alert.  Psychiatric:        Mood and Affect: Mood and affect normal.        Speech: Speech normal.        Behavior: Behavior normal.      ED Treatments / Results  Labs (all labs ordered are listed, but only abnormal results are displayed) Labs Reviewed - No data to display  EKG None  Radiology No results found.  Procedures Procedures (including critical care time)  Medications Ordered in ED Medications - No data to display   Initial Impression / Assessment and Plan / ED Course  I have reviewed the triage vital signs and the nursing notes.  Pertinent labs & imaging results that were available during my care of the patient were reviewed by me and considered in my medical decision making (see chart for details).        Patient presents with generalized body aches beginning today. Patient is nontoxic appearing, afebrile, not tachycardic, not tachypneic, not hypotensive, excellent SPO2 on room air, and is in no apparent distress.  He was offered a chest x-ray and COVID testing with explanations for  both, but declined. At the end of the visit, he stated, "I really just wanted someone to take a look at me, get my vital signs, and listen to my lungs." The patient was given instructions for home care as well as return precautions. Patient voices understanding of these instructions, accepts the plan, and is comfortable with discharge.  Final Clinical Impressions(s) / ED Diagnoses   Final diagnoses:  Generalized body aches    ED Discharge Orders    None       Layla Maw 09/22/18 1431    Dorie Rank, MD 09/25/18 [id number]

## 2018-09-21 NOTE — ED Triage Notes (Signed)
Pt complaint of body aches and "cold symptoms" for a week.

## 2018-10-04 ENCOUNTER — Encounter (HOSPITAL_COMMUNITY): Payer: Self-pay | Admitting: Emergency Medicine

## 2018-10-04 ENCOUNTER — Other Ambulatory Visit: Payer: Self-pay

## 2018-10-04 ENCOUNTER — Emergency Department (HOSPITAL_COMMUNITY)
Admission: EM | Admit: 2018-10-04 | Discharge: 2018-10-04 | Disposition: A | Discharge status: Home / Self Care | Payer: Self-pay | Attending: Emergency Medicine | Admitting: Emergency Medicine

## 2018-10-04 DIAGNOSIS — F17210 Nicotine dependence, cigarettes, uncomplicated: Secondary | ICD-10-CM | POA: Insufficient documentation

## 2018-10-04 DIAGNOSIS — Z79899 Other long term (current) drug therapy: Secondary | ICD-10-CM | POA: Insufficient documentation

## 2018-10-04 DIAGNOSIS — J45909 Unspecified asthma, uncomplicated: Secondary | ICD-10-CM | POA: Insufficient documentation

## 2018-10-04 DIAGNOSIS — F1210 Cannabis abuse, uncomplicated: Secondary | ICD-10-CM | POA: Insufficient documentation

## 2018-10-04 DIAGNOSIS — R070 Pain in throat: Secondary | ICD-10-CM | POA: Insufficient documentation

## 2018-10-04 DIAGNOSIS — J029 Acute pharyngitis, unspecified: Secondary | ICD-10-CM

## 2018-10-04 LAB — GROUP A STREP BY PCR: Group A Strep by PCR: NOT DETECTED

## 2018-10-04 MED ORDER — AZITHROMYCIN 250 MG PO TABS: 250.0000 mg | ORAL_TABLET | Freq: Every day | ORAL | 0 refills | Status: AC

## 2018-10-04 MED ORDER — LIDOCAINE VISCOUS HCL 2 % MT SOLN
15.0000 mL | Freq: Once | OROMUCOSAL | Status: AC
  Administered 2018-10-04: 15 mL via OROMUCOSAL
  Filled 2018-10-04: qty 15

## 2018-10-04 MED ORDER — AZITHROMYCIN 250 MG PO TABS
500.0000 mg | ORAL_TABLET | Freq: Once | ORAL | Status: AC
  Administered 2018-10-04: 500 mg via ORAL
  Filled 2018-10-04: qty 2

## 2018-10-04 NOTE — ED Notes (Signed)
Patient refused d/c vital signs. 

## 2018-10-04 NOTE — Discharge Instructions (Signed)
As discussed, your evaluation today has been largely reassuring.  But, it is important that you monitor your condition carefully, and do not hesitate to return to the ED if you develop new, or concerning changes in your condition.  Otherwise, please follow-up with your physician for appropriate ongoing care.  Equally important is that you continue to pursue your efforts to stop smoking.

## 2018-10-04 NOTE — ED Triage Notes (Signed)
Pt complaint of sore throat and coughing up green/yellow/red mucous for 2 days.

## 2018-10-04 NOTE — ED Notes (Signed)
ED Provider at bedside. 

## 2018-10-04 NOTE — ED Provider Notes (Signed)
[facility]-EMERGENCY DEPT Provider Note   CSN: [id number] Arrival date & time: 10/04/18  1516     History   Chief Complaint Chief Complaint  Patient presents with  . Sore Throat    HPI Allen Jensen is a 23 y.o. male.     HPI Patient presents with concern of 3 days of sore throat, increased mucus production. Patient was well prior to this, states that he is a generally healthy individual.  He does smoke cigarettes, the last one was 4 days ago. Since onset, no relief with OTC medication. He notes associated warm and chilled sensation, is unaware of an objective fever. No vomiting, no diarrhea, no appreciable cough. Past Medical History:  Diagnosis Date  . Asthma   . Bronchitis   . Color blind   . Heart murmur     There are no active problems to display for this patient.   History reviewed. No pertinent surgical history.      Home Medications    Prior to Admission medications   Medication Sig Start Date End Date Taking? Authorizing Provider  amoxicillin (AMOXIL) 500 MG capsule Take 1 capsule (500 mg total) by mouth 3 (three) times daily. 06/23/18   Jacalyn LefevreHaviland, Julie, MD  azithromycin (ZITHROMAX) 250 MG tablet Take 1 tablet (250 mg total) by mouth daily for 4 days. Take 1 every day until finished. 10/04/18 10/08/18  Gerhard MunchLockwood, Yoselyn Mcglade, MD  carbamide peroxide (DEBROX) 6.5 % OTIC solution Place 5 drops into both ears 2 (two) times daily. 06/23/18   Jacalyn LefevreHaviland, Julie, MD  fluticasone (FLONASE) 50 MCG/ACT nasal spray Place 2 sprays into both nostrils daily. 07/09/18   Cathie HoopsYu, Amy V, PA-C  neomycin-polymyxin-hydrocortisone (CORTISPORIN) OTIC solution Place 4 drops into the left ear 4 (four) times daily. 06/30/18   Bing NeighborsHarris, Kimberly S, FNP    Family History Family History  Family history unknown: Yes    Social History Social History   Tobacco Use  . Smoking status: Current Every Day Smoker    Packs/day: 0.10    Types: Cigarettes  . Smokeless tobacco:  Never Used  . Tobacco comment: 3 cigarettes/day  Substance Use Topics  . Alcohol use: No  . Drug use: Yes    Types: Marijuana     Allergies   Other   Review of Systems Review of Systems  Constitutional:       Per HPI, otherwise negative  HENT:       Per HPI, otherwise negative  Respiratory:       Per HPI, otherwise negative  Cardiovascular:       Per HPI, otherwise negative  Gastrointestinal: Negative for vomiting.  Endocrine:       Negative aside from HPI  Genitourinary:       Neg aside from HPI   Musculoskeletal:       Per HPI, otherwise negative  Skin: Negative.   Neurological: Negative for syncope.     Physical Exam Updated Vital Signs BP 116/66   Pulse 70   Temp 98.6 F (37 C) (Oral)   Resp 16   Ht 6\' 2"  (1.88 m)   Wt 70.8 kg   SpO2 100%   BMI 20.03 kg/m   Physical Exam Vitals signs and nursing note reviewed.  Constitutional:      General: He is not in acute distress.    Appearance: He is well-developed.  HENT:     Head: Normocephalic and atraumatic.     Mouth/Throat:     Mouth: Mucous  membranes are moist. No oral lesions.     Pharynx: Posterior oropharyngeal erythema present. No oropharyngeal exudate.  Eyes:     Conjunctiva/sclera: Conjunctivae normal.  Cardiovascular:     Rate and Rhythm: Normal rate and regular rhythm.  Pulmonary:     Effort: Pulmonary effort is normal. No respiratory distress.     Breath sounds: No stridor.  Abdominal:     General: There is no distension.  Skin:    General: Skin is warm and dry.  Neurological:     Mental Status: He is alert and oriented to person, place, and time.      ED Treatments / Results  Labs (all labs ordered are listed, but only abnormal results are displayed) Labs Reviewed  GROUP A STREP BY PCR   Procedures Procedures (including critical care time)  Medications Ordered in ED Medications  azithromycin (ZITHROMAX) tablet 500 mg (has no administration in time range)  lidocaine  (XYLOCAINE) 2 % viscous mouth solution 15 mL (15 mLs Mouth/Throat Given 10/04/18 1645)     Initial Impression / Assessment and Plan / ED Course  I have reviewed the triage vital signs and the nursing notes.  Pertinent labs & imaging results that were available during my care of the patient were reviewed by me and considered in my medical decision making (see chart for details).   6:16 PM Patient in no distress, awake, alert.  No evidence for respiratory compromise, bacteremia, sepsis, no fever. Some suspicion for sore throat, though the patient smoking history is likely contributing to his mucus production, discomfort. Absent alarming findings, the patient start on a short course of antibiotics, will follow-up with primary care.  Final Clinical Impressions(s) / ED Diagnoses   Final diagnoses:  Sore throat    ED Discharge Orders         Ordered    azithromycin (ZITHROMAX) 250 MG tablet  Daily     10/04/18 1815           Carmin Muskrat, MD 10/04/18 1824

## 2018-11-01 ENCOUNTER — Other Ambulatory Visit: Payer: Self-pay

## 2018-11-01 ENCOUNTER — Emergency Department (HOSPITAL_COMMUNITY): Payer: Self-pay

## 2018-11-01 ENCOUNTER — Encounter (HOSPITAL_COMMUNITY): Payer: Self-pay | Admitting: Emergency Medicine

## 2018-11-01 DIAGNOSIS — Z5321 Procedure and treatment not carried out due to patient leaving prior to being seen by health care provider: Secondary | ICD-10-CM | POA: Insufficient documentation

## 2018-11-01 NOTE — ED Triage Notes (Signed)
Productive cough for a month; SOB with activity; denies other symptoms or pain; denies COVID exposure.

## 2018-11-02 ENCOUNTER — Emergency Department (HOSPITAL_COMMUNITY)
Admission: EM | Admit: 2018-11-02 | Discharge: 2018-11-02 | Discharge status: Against Medical Advice | Payer: Self-pay | Attending: Emergency Medicine | Admitting: Emergency Medicine

## 2019-07-08 ENCOUNTER — Emergency Department (HOSPITAL_COMMUNITY)
Admission: EM | Admit: 2019-07-08 | Discharge: 2019-07-08 | Disposition: A | Discharge status: Home / Self Care | Payer: Self-pay | Attending: Emergency Medicine | Admitting: Emergency Medicine

## 2019-07-08 ENCOUNTER — Encounter (HOSPITAL_COMMUNITY): Payer: Self-pay | Admitting: Emergency Medicine

## 2019-07-08 ENCOUNTER — Other Ambulatory Visit: Payer: Self-pay

## 2019-07-08 DIAGNOSIS — Z5321 Procedure and treatment not carried out due to patient leaving prior to being seen by health care provider: Secondary | ICD-10-CM | POA: Insufficient documentation

## 2019-07-08 DIAGNOSIS — M79644 Pain in right finger(s): Secondary | ICD-10-CM | POA: Insufficient documentation

## 2019-07-08 NOTE — ED Notes (Signed)
I called patient name in the lobby and outside for a room and no one responded 

## 2019-07-08 NOTE — ED Triage Notes (Signed)
Pt injured right ring finger Tuesday. reports must hit it again bc was bleeding again.

## 2020-02-15 ENCOUNTER — Emergency Department (HOSPITAL_COMMUNITY)
Admission: EM | Admit: 2020-02-15 | Discharge: 2020-02-15 | Disposition: A | Discharge status: Home / Self Care | Payer: Medicaid Other | Attending: Emergency Medicine | Admitting: Emergency Medicine

## 2020-02-15 ENCOUNTER — Encounter (HOSPITAL_COMMUNITY): Payer: Self-pay | Admitting: Emergency Medicine

## 2020-02-15 ENCOUNTER — Other Ambulatory Visit: Payer: Self-pay

## 2020-02-15 DIAGNOSIS — G43009 Migraine without aura, not intractable, without status migrainosus: Secondary | ICD-10-CM | POA: Insufficient documentation

## 2020-02-15 DIAGNOSIS — H538 Other visual disturbances: Secondary | ICD-10-CM | POA: Insufficient documentation

## 2020-02-15 DIAGNOSIS — Z5321 Procedure and treatment not carried out due to patient leaving prior to being seen by health care provider: Secondary | ICD-10-CM | POA: Insufficient documentation

## 2020-02-15 NOTE — ED Triage Notes (Signed)
Patient states he has a headache the last 3-4 weeks. States he might need more vitamins and a better diet. Hx of migraines, states he has a migraine starting, and intermittent blurred vision starting yesterday, but it has since resolved. 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

## 2021-02-08 IMAGING — CR DG CHEST 2V
2 series · 2 of 2 positions shown · non-contrast
Comparison: 01/16/2018

CLINICAL DATA: Cough and shortness of breath.

EXAM:
CHEST - 2 VIEW

[w chest pa]
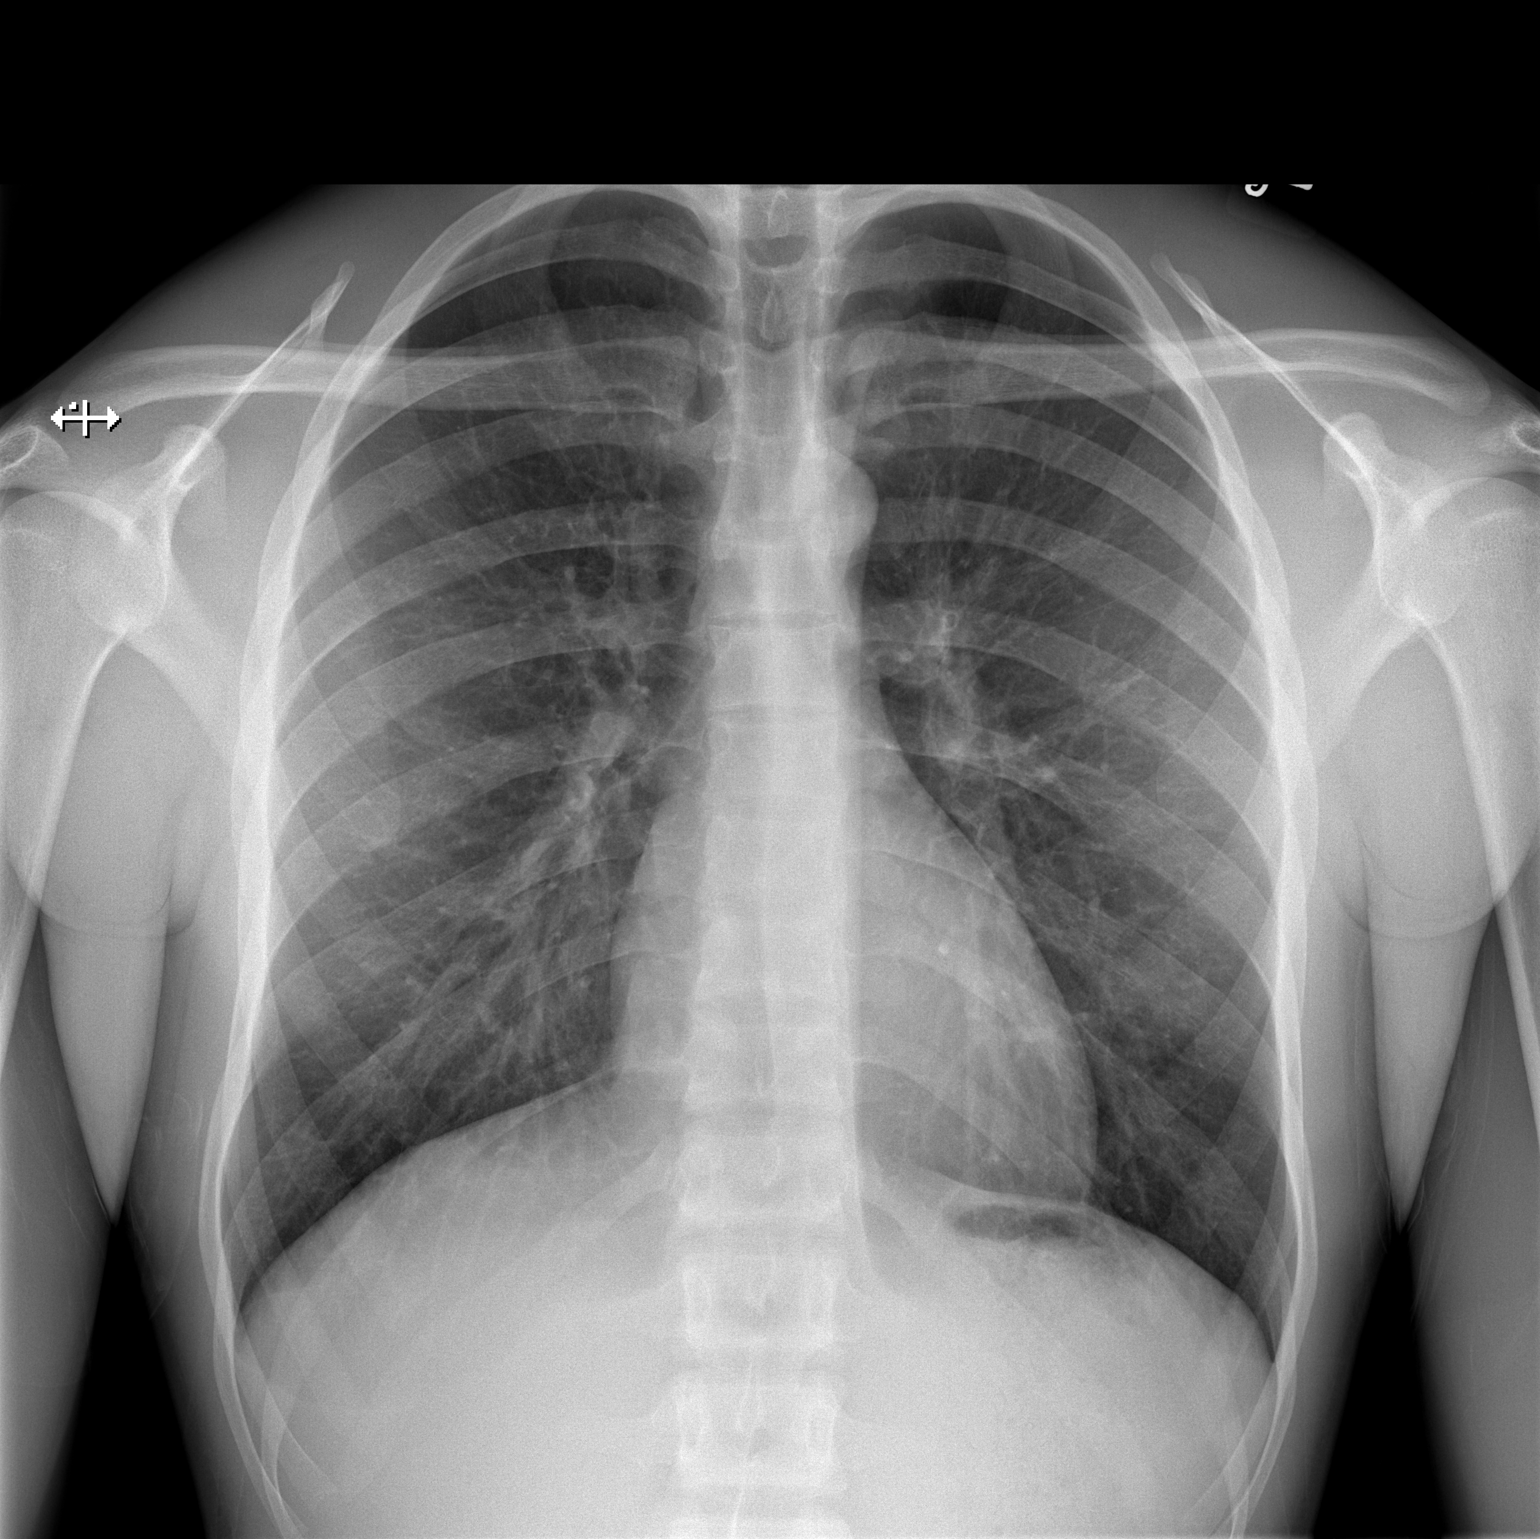

[w chest lat]
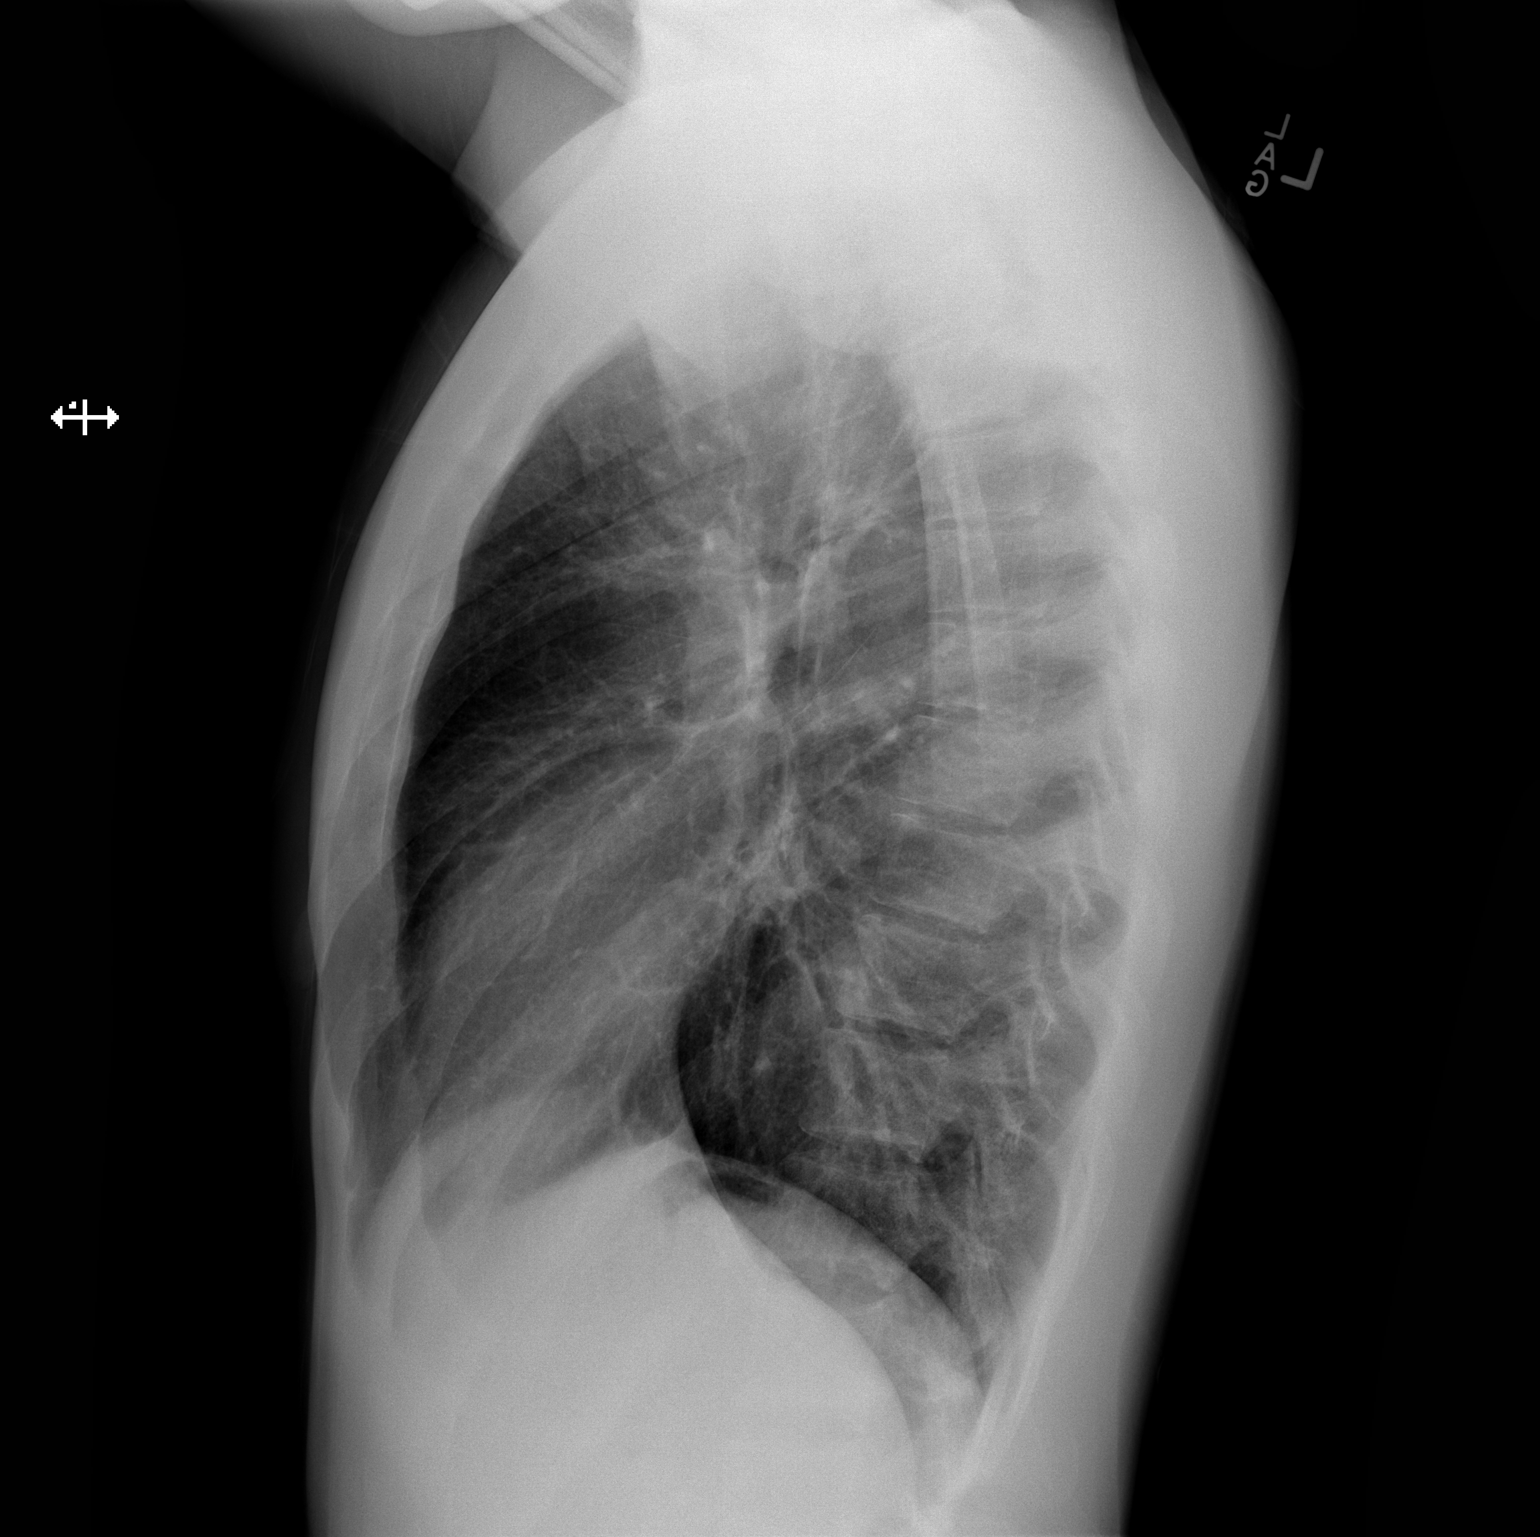

[2 of 2 positions shown; findings below may reference images not displayed]

FINDINGS: Mild central bronchial thickening.The cardiomediastinal contours are
normal. Pulmonary vasculature is normal. No consolidation, pleural
effusion, or pneumothorax. No acute osseous abnormalities are seen.
IMPRESSION: Mild central bronchial thickening, asthma or bronchitis. No
consolidation.

## 2021-08-21 ENCOUNTER — Ambulatory Visit (HOSPITAL_COMMUNITY)
Admission: EM | Admit: 2021-08-21 | Discharge: 2021-08-21 | Disposition: A | Discharge status: Home / Self Care | Payer: Self-pay

## 2021-08-21 ENCOUNTER — Encounter (HOSPITAL_COMMUNITY): Payer: Self-pay

## 2021-08-21 ENCOUNTER — Ambulatory Visit (INDEPENDENT_AMBULATORY_CARE_PROVIDER_SITE_OTHER): Payer: Self-pay

## 2021-08-21 ENCOUNTER — Ambulatory Visit (HOSPITAL_COMMUNITY)
Admission: RE | Admit: 2021-08-21 | Discharge: 2021-08-21 | Disposition: A | Discharge status: Home / Self Care | Payer: Self-pay | Source: Ambulatory Visit | Attending: Physician Assistant | Admitting: Physician Assistant

## 2021-08-21 VITALS — BP 119/72 | HR 69 | Temp 97.9°F | Resp 16 | Ht 75.0 in | Wt 170.0 lb

## 2021-08-21 DIAGNOSIS — M25571 Pain in right ankle and joints of right foot: Secondary | ICD-10-CM

## 2021-08-21 DIAGNOSIS — G8929 Other chronic pain: Secondary | ICD-10-CM

## 2021-08-21 DIAGNOSIS — G43109 Migraine with aura, not intractable, without status migrainosus: Secondary | ICD-10-CM

## 2021-08-21 DIAGNOSIS — S9001XA Contusion of right ankle, initial encounter: Secondary | ICD-10-CM

## 2021-08-21 MED ORDER — NAPROXEN SODIUM 550 MG PO TABS: 550.0000 mg | ORAL_TABLET | Freq: Two times a day (BID) | ORAL | 1 refills | Status: DC

## 2021-08-21 MED ORDER — ONDANSETRON HCL 4 MG PO TABS: 4.0000 mg | ORAL_TABLET | Freq: Three times a day (TID) | ORAL | 0 refills | Status: DC | PRN

## 2021-08-21 NOTE — Discharge Instructions (Addendum)
Advised to take the Anaprox DS at the onset of headache along with the Zofran to control the nausea. Advised to change lifestyle habits in order to decrease the severity and occurrence of the migraines.  (Reduce the amount of smoking, regular nutritional intake, stress reduction, good sleep hygiene habits where you are getting 6 to 7 hours of sleep at night, regular cardio exercise.) Advised to follow-up with PCP on developing a migraine headache prevention plan. Advised to follow-up with orthopedics on evaluation of the right ankle pain. (You have been referred to [facility] for evaluation of the right ankle pain, he should be calling you within the next 48 hours to set up an appointment.)

## 2021-08-21 NOTE — ED Provider Notes (Signed)
[facility]    CSN: [id number] Arrival date & time: 08/21/21  1313      History   Chief Complaint Chief Complaint  Patient presents with   Ankle Pain    Right    Migraine    HPI Allen Jensen is a 26 y.o. male.   26 year old male presents with headaches, right ankle pain.  Patient relates that he has migraine headaches for most of his life.  Patient indicates for the past 4 months starting in March his migraine headaches have been worse and more frequent.  Patient indicates over the past several weeks he has been having 2-3 migraine headaches a week.  The headaches tend to alternate sides, throbbing, with nausea and vomiting associated.  Patient indicates that he has an aura before he gets the migraines to where he has mild eye lid heaviness and then shortly after the migraine starts.  Patient indicates the migraine usually last for 2 to 3 hours.  Patient does get relief when he goes to a dark room and lies down and is quiet.  Patient has been taking ibuprofen 800 mg without relief of the headaches.  Patient does relate that he works in a hot environment where he is concerned about being overly overheated and this may be causing his headaches.  Patient also relates that he is under increased stress particularly over the past several months working several different jobs, he is not getting but 4 to 5 hours of sleep at night and this is restless, he has not eating regular meals and sometimes only eats 1 meal a day.  He drinks a lot of caffeinated products in order to keep going and to be able to focus.  Patient also is smoking and vaping on a regular basis throughout the day.  Patient does not know exactly what is causing his migraine headaches to get worse, he does not want to take a daily preventative medicine.  Patient also relates that he is having right ankle pain and swelling that bothers him on a daily basis.  The pain is worse when he is walking, and relates this started  after he rolled his right ankle 8 months ago.  Patient indicates that the swelling never resolves.  Patient does not have any weakness, numbness, tingling.  Patient relates that the ibuprofen does not relieve his discomfort.   Ankle Pain Migraine Associated symptoms include headaches.    Past Medical History:  Diagnosis Date   Asthma    Bronchitis    Color blind    Heart murmur     There are no problems to display for this patient.   History reviewed. No pertinent surgical history.     Home Medications    Prior to Admission medications   Medication Sig Start Date End Date Taking? Authorizing Provider  naproxen sodium (ANAPROX DS) 550 MG tablet Take 1 tablet (550 mg total) by mouth 2 (two) times daily with a meal. 08/21/21  Yes Nyoka Lint, PA-C  ondansetron (ZOFRAN) 4 MG tablet Take 1 tablet (4 mg total) by mouth every 8 (eight) hours as needed for nausea or vomiting. 08/21/21  Yes Nyoka Lint, PA-C  amoxicillin (AMOXIL) 500 MG capsule Take 1 capsule (500 mg total) by mouth 3 (three) times daily. 06/23/18   Isla Pence, MD  carbamide peroxide (DEBROX) 6.5 % OTIC solution Place 5 drops into both ears 2 (two) times daily. 06/23/18   Isla Pence, MD  fluticasone (FLONASE) 50 MCG/ACT nasal spray Place  2 sprays into both nostrils daily. 07/09/18   Cathie Hoops, Amy V, PA-C  neomycin-polymyxin-hydrocortisone (CORTISPORIN) OTIC solution Place 4 drops into the left ear 4 (four) times daily. 06/30/18   Bing Neighbors, FNP    Family History Family History  Family history unknown: Yes    Social History Social History   Tobacco Use   Smoking status: Former    Packs/day: 0.10    Types: Cigarettes   Smokeless tobacco: Never   Tobacco comments:    3 cigarettes/day  Vaping Use   Vaping Use: Some days  Substance Use Topics   Alcohol use: No   Drug use: Yes    Types: Marijuana     Allergies   Other   Review of Systems Review of Systems  Musculoskeletal:  Positive for  joint swelling (right ankle pain and swelling).  Neurological:  Positive for headaches.     Physical Exam Triage Vital Signs ED Triage Vitals  Enc Vitals Group     BP 08/21/21 1352 119/72     Pulse Rate 08/21/21 1352 69     Resp 08/21/21 1352 16     Temp 08/21/21 1352 97.9 F (36.6 C)     Temp Source 08/21/21 1352 Oral     SpO2 08/21/21 1352 100 %     Weight 08/21/21 1353 170 lb (77.1 kg)     Height 08/21/21 1353 6\' 3"  (1.905 m)     Head Circumference --      Peak Flow --      Pain Score 08/21/21 1353 8     Pain Loc --      Pain Edu? --      Excl. in GC? --    No data found.  Updated Vital Signs BP 119/72 (BP Location: Left Arm)   Pulse 69   Temp 97.9 F (36.6 C) (Oral)   Resp 16   Ht 6\' 3"  (1.905 m)   Wt 170 lb (77.1 kg)   SpO2 100%   BMI 21.25 kg/m   Visual Acuity Right Eye Distance:   Left Eye Distance:   Bilateral Distance:    Right Eye Near:   Left Eye Near:    Bilateral Near:     Physical Exam Constitutional:      Appearance: Normal appearance.  HENT:     Right Ear: Tympanic membrane and ear canal normal.     Left Ear: Tympanic membrane and ear canal normal.     Mouth/Throat:     Mouth: Mucous membranes are moist.     Pharynx: Oropharynx is clear. No pharyngeal swelling or posterior oropharyngeal erythema.  Cardiovascular:     Rate and Rhythm: Normal rate and regular rhythm.     Heart sounds: Normal heart sounds.  Pulmonary:     Effort: Pulmonary effort is normal.     Breath sounds: Normal breath sounds and air entry. No wheezing, rhonchi or rales.  Musculoskeletal:     Right ankle: Swelling (lateral malleolus) present. Tenderness (lateral malleolus) present. Decreased range of motion. Anterior drawer test positive.       Legs:     Comments: Right ankle: The patient is exquisitely tender on palpation at the area of the radiology interpretation where a possible oval lucency is identified.  Lymphadenopathy:     Cervical: No cervical adenopathy.   Neurological:     Mental Status: He is alert.      UC Treatments / Results  Labs (all labs ordered are listed, but only abnormal  results are displayed) Labs Reviewed - No data to display  EKG   Radiology DG Ankle Complete Right  Result Date: 08/21/2021 CLINICAL DATA:  Inverted right ankle 8 months ago. Pain and swelling. EXAM: RIGHT ANKLE - COMPLETE 3+ VIEW COMPARISON:  None Available. FINDINGS: Mild lateral soft tissue swelling.  No fracture or dislocation. Oval lucency identified in the anterior distal tibia on the lateral view only. Mild irregularity in this region. IMPRESSION: 1. The oval lucency projected over the distal anterior aspect of the tibia with mild associated irregularity may be sequela from previous trauma 8 months ago. This region could be better assessed with MRI as an outpatient if clinically warranted. 2. Mild lateral soft tissue swelling. 3. No acute abnormalities are identified. Electronically Signed   By: Gerome Sam III M.D.   On: 08/21/2021 15:05    Procedures Procedures (including critical care time)  Medications Ordered in UC Medications - No data to display  Initial Impression / Assessment and Plan / UC Course  I have reviewed the triage vital signs and the nursing notes.  Pertinent labs & imaging results that were available during my care of the patient were reviewed by me and considered in my medical decision making (see chart for details).    Plan: 1.  Patient advised to change lifestyle habits in order to try to reduce the frequency and severity of migraines.  (Decrease smoking, good nutritional intake, regular cardiovascular exercise, good sleep hygiene at least 6 to 7 hours a night, and stress reduction.) 2.  Advised patient to take Anaprox DS 1 tablet at the onset of the headache along with Zofran to control the nausea 3.  Patient has been referred to orthopedics for evaluation of the chronic right ankle pain. 4.  Patient is advised to  become established with PCP in order to determine a good migraine headache treatment plan. 5.  Patient to return to urgent care if symptoms fail to improve. Final Clinical Impressions(s) / UC Diagnoses   Final diagnoses:  Migraine with aura and without status migrainosus, not intractable  Chronic pain of right ankle  Contusion of right ankle, initial encounter     Discharge Instructions      Advised to take the Anaprox DS at the onset of headache along with the Zofran to control the nausea. Advised to change lifestyle habits in order to decrease the severity and occurrence of the migraines.  (Reduce the amount of smoking, regular nutritional intake, stress reduction, good sleep hygiene habits where you are getting 6 to 7 hours of sleep at night, regular cardio exercise.) Advised to follow-up with PCP on developing a migraine headache prevention plan. Advised to follow-up with orthopedics on evaluation of the right ankle pain. (You have been referred to [facility] for evaluation of the right ankle pain, he should be calling you within the next 48 hours to set up an appointment.)    ED Prescriptions     Medication Sig Dispense Auth. Provider   naproxen sodium (ANAPROX DS) 550 MG tablet Take 1 tablet (550 mg total) by mouth 2 (two) times daily with a meal. 20 tablet Ellsworth Lennox, PA-C   ondansetron (ZOFRAN) 4 MG tablet Take 1 tablet (4 mg total) by mouth every 8 (eight) hours as needed for nausea or vomiting. 20 tablet Ellsworth Lennox, PA-C      PDMP not reviewed this encounter.   Ellsworth Lennox, PA-C 08/21/21 1518

## 2021-08-21 NOTE — ED Triage Notes (Signed)
Patient having frequent migraines since last week. Having nausea and vomiting with eat migraine. Patient works outside and taking over heating precautions. Still having the migraines.   Patient also having swelling in the right ankle for 8 months. States he rolled it and has had trouble with it since. Was not seen for ankle pain when this first happened.

## 2022-02-10 ENCOUNTER — Other Ambulatory Visit: Payer: Self-pay

## 2022-02-10 ENCOUNTER — Emergency Department (HOSPITAL_COMMUNITY)
Admission: EM | Admit: 2022-02-10 | Discharge: 2022-02-10 | Disposition: A | Discharge status: Home / Self Care | Payer: Medicaid Other | Attending: Emergency Medicine | Admitting: Emergency Medicine

## 2022-02-10 ENCOUNTER — Encounter (HOSPITAL_COMMUNITY): Payer: Self-pay

## 2022-02-10 DIAGNOSIS — Z1152 Encounter for screening for COVID-19: Secondary | ICD-10-CM | POA: Insufficient documentation

## 2022-02-10 DIAGNOSIS — J101 Influenza due to other identified influenza virus with other respiratory manifestations: Secondary | ICD-10-CM | POA: Insufficient documentation

## 2022-02-10 DIAGNOSIS — J45909 Unspecified asthma, uncomplicated: Secondary | ICD-10-CM | POA: Insufficient documentation

## 2022-02-10 LAB — RESP PANEL BY RT-PCR (RSV, FLU A&B, COVID)  RVPGX2
Influenza A by PCR: NEGATIVE
Influenza B by PCR: POSITIVE — AB
Resp Syncytial Virus by PCR: NEGATIVE
SARS Coronavirus 2 by RT PCR: NEGATIVE

## 2022-02-10 LAB — GROUP A STREP BY PCR: Group A Strep by PCR: NOT DETECTED

## 2022-02-10 MED ORDER — ACETAMINOPHEN 325 MG PO TABS
650.0000 mg | ORAL_TABLET | Freq: Once | ORAL | Status: AC
  Administered 2022-02-10: 650 mg via ORAL
  Filled 2022-02-10: qty 2

## 2022-02-10 NOTE — ED Provider Notes (Signed)
[facility] DEPT Provider Note   CSN: [id number] Arrival date & time: 02/10/22  0847     History  Chief Complaint  Patient presents with   Fever   Nasal Congestion   Diarrhea   Nausea    Allen Jensen is a 27 y.o. male.   Fever Associated symptoms: diarrhea   Diarrhea Associated symptoms: fever      Patient with medical history of asthma presents to the emergency department due to fever, generalized body aches, nonproductive cough, posttussive emesis and diarrhea and sore throat x 2 days.  No pulm medicine prior to arrival.  States checked temperature today and it was elevated came to the ED.  No chest pain, does not feel short of breath.  No recent antibiotic steroids, no recent hospitalizations for previous asthma exacerbations.  Home Medications Prior to Admission medications   Medication Sig Start Date End Date Taking? Authorizing Provider  amoxicillin (AMOXIL) 500 MG capsule Take 1 capsule (500 mg total) by mouth 3 (three) times daily. 06/23/18   Isla Pence, MD  carbamide peroxide (DEBROX) 6.5 % OTIC solution Place 5 drops into both ears 2 (two) times daily. 06/23/18   Isla Pence, MD  fluticasone (FLONASE) 50 MCG/ACT nasal spray Place 2 sprays into both nostrils daily. 07/09/18   Tasia Catchings, Amy V, PA-C  naproxen sodium (ANAPROX DS) 550 MG tablet Take 1 tablet (550 mg total) by mouth 2 (two) times daily with a meal. 08/21/21   Nyoka Lint, PA-C  neomycin-polymyxin-hydrocortisone (CORTISPORIN) OTIC solution Place 4 drops into the left ear 4 (four) times daily. 06/30/18   Scot Jun, FNP  ondansetron (ZOFRAN) 4 MG tablet Take 1 tablet (4 mg total) by mouth every 8 (eight) hours as needed for nausea or vomiting. 08/21/21   Nyoka Lint, PA-C      Allergies    Other    Review of Systems   Review of Systems  Constitutional:  Positive for fever.  Gastrointestinal:  Positive for diarrhea.    Physical Exam Updated Vital Signs BP  138/79   Pulse 91   Temp 100.3 F (37.9 C) (Oral)   Resp 18   SpO2 98%  Physical Exam Vitals and nursing note reviewed. Exam conducted with a chaperone present.  Constitutional:      Appearance: Normal appearance.  HENT:     Head: Normocephalic and atraumatic.     Nose: Congestion present.     Mouth/Throat:     Pharynx: Posterior oropharyngeal erythema present.  Eyes:     General: No scleral icterus.       Right eye: No discharge.        Left eye: No discharge.     Extraocular Movements: Extraocular movements intact.     Pupils: Pupils are equal, round, and reactive to light.  Cardiovascular:     Rate and Rhythm: Normal rate and regular rhythm.     Pulses: Normal pulses.     Heart sounds: Normal heart sounds.     No friction rub. No gallop.  Pulmonary:     Effort: Pulmonary effort is normal.     Breath sounds: Normal breath sounds.     Comments: Speaking complete sentences Abdominal:     General: Abdomen is flat. Bowel sounds are normal. There is no distension.     Palpations: Abdomen is soft.     Tenderness: There is no abdominal tenderness.  Skin:    General: Skin is warm and dry.  Coloration: Skin is not jaundiced.  Neurological:     Mental Status: He is alert. Mental status is at baseline.     Coordination: Coordination normal.     ED Results / Procedures / Treatments   Labs (all labs ordered are listed, but only abnormal results are displayed) Labs Reviewed  GROUP A STREP BY PCR  RESP PANEL BY RT-PCR (RSV, FLU A&B, COVID)  [id number]    EKG None  Radiology No results found.  Procedures Procedures    Medications Ordered in ED Medications  acetaminophen (TYLENOL) tablet 650 mg (650 mg Oral Given 02/10/22 1028)    ED Course/ Medical Decision Making/ A&P                           Medical Decision Making Risk OTC drugs.   Patient presents to the emergency department due to constellation of viral symptoms.  He is borderline febrile with a  temperature of 100.3.  His lungs are clear to auscultation, not tachycardic.  Overall is well-appearing and abdomen is soft nontender.  Suspect viral process.  Will check viral panel and also check strep panel given he is some erythema to the posterior oropharynx.  Patient is positive for influenza B.  Feels somewhat improved after Tylenol administration.  Given patient is not hypoxic, tachycardic and is overall young and healthy I do not think he needs hospitalization.  Patient stable for outpatient follow-up at this time with supportive care.        Final Clinical Impression(s) / ED Diagnoses Final diagnoses:  None    Rx / DC Orders ED Discharge Orders     None         Sherrill Raring, [place] 02/10/22 1243    Valarie Merino, MD 02/10/22 1330

## 2022-02-10 NOTE — ED Triage Notes (Signed)
Pt c/o fever, cough, congestion, nausea, diarrhea x2 days.

## 2022-02-10 NOTE — Discharge Instructions (Addendum)
You have the flu.  This is a virus that should pass all on its own.  You drink plenty of fluids, take Tylenol 1000 mg 3 times daily alternating with Motrin 600 mg daily.  This can help keep the fever down will help you feel better overall.  Given her blood work when you are fever free.

## 2022-06-06 ENCOUNTER — Emergency Department (HOSPITAL_COMMUNITY)
Admission: EM | Admit: 2022-06-06 | Discharge: 2022-06-07 | Disposition: A | Discharge status: Home / Self Care | Payer: Self-pay | Attending: Emergency Medicine | Admitting: Emergency Medicine

## 2022-06-06 ENCOUNTER — Encounter (HOSPITAL_COMMUNITY): Payer: Self-pay

## 2022-06-06 ENCOUNTER — Emergency Department (HOSPITAL_COMMUNITY): Payer: Self-pay

## 2022-06-06 ENCOUNTER — Other Ambulatory Visit: Payer: Self-pay

## 2022-06-06 DIAGNOSIS — R064 Hyperventilation: Secondary | ICD-10-CM | POA: Insufficient documentation

## 2022-06-06 DIAGNOSIS — R4182 Altered mental status, unspecified: Secondary | ICD-10-CM | POA: Insufficient documentation

## 2022-06-06 DIAGNOSIS — F4322 Adjustment disorder with anxiety: Secondary | ICD-10-CM | POA: Insufficient documentation

## 2022-06-06 DIAGNOSIS — F419 Anxiety disorder, unspecified: Secondary | ICD-10-CM

## 2022-06-06 LAB — SALICYLATE LEVEL: Salicylate Lvl: <7 mg/dL — ABNORMAL LOW (ref 7.0–30.0)

## 2022-06-06 LAB — RAPID URINE DRUG SCREEN, HOSP PERFORMED
Amphetamines: NOT DETECTED
Barbiturates: NOT DETECTED
Benzodiazepines: POSITIVE — AB
Cocaine: NOT DETECTED
Opiates: NOT DETECTED
Tetrahydrocannabinol: POSITIVE — AB

## 2022-06-06 LAB — I-STAT CHEM 8, ED
BUN: 13 mg/dL (ref 6–20)
Calcium, Ion: 1.06 mmol/L — ABNORMAL LOW (ref 1.15–1.40)
Chloride: 105 mmol/L (ref 98–111)
Creatinine, Ser: 1.1 mg/dL (ref 0.61–1.24)
Glucose, Bld: 112 mg/dL — ABNORMAL HIGH (ref 70–99)
HCT: 46 % (ref 39.0–52.0)
Hemoglobin: 15.6 g/dL (ref 13.0–17.0)
Potassium: 3.7 mmol/L (ref 3.5–5.1)
Sodium: 140 mmol/L (ref 135–145)
TCO2: 22 mmol/L (ref 22–32)

## 2022-06-06 LAB — COMPREHENSIVE METABOLIC PANEL
ALT: 17 U/L (ref 0–44)
AST: 31 U/L (ref 15–41)
Albumin: 4.9 g/dL (ref 3.5–5.0)
Alkaline Phosphatase: 58 U/L (ref 38–126)
Anion gap: 16 — ABNORMAL HIGH (ref 5–15)
BUN: 12 mg/dL (ref 6–20)
CO2: 20 mmol/L — ABNORMAL LOW (ref 22–32)
Calcium: 9.8 mg/dL (ref 8.9–10.3)
Chloride: 102 mmol/L (ref 98–111)
Creatinine, Ser: 1.32 mg/dL — ABNORMAL HIGH (ref 0.61–1.24)
GFR, Estimated: >60 mL/min (ref >60)
Glucose, Bld: 106 mg/dL — ABNORMAL HIGH (ref 70–99)
Potassium: 4.2 mmol/L (ref 3.5–5.1)
Sodium: 138 mmol/L (ref 135–145)
Total Bilirubin: 1 mg/dL (ref 0.3–1.2)
Total Protein: 7.9 g/dL (ref 6.5–8.1)

## 2022-06-06 LAB — URINALYSIS, W/ REFLEX TO CULTURE (INFECTION SUSPECTED)
Bacteria, UA: NONE SEEN
Bilirubin Urine: NEGATIVE
Glucose, UA: NEGATIVE mg/dL
Hgb urine dipstick: NEGATIVE
Ketones, ur: NEGATIVE mg/dL
Leukocytes,Ua: NEGATIVE
Nitrite: NEGATIVE
Protein, ur: NEGATIVE mg/dL
Specific Gravity, Urine: 1.009 (ref 1.005–1.030)
pH: 7 (ref 5.0–8.0)

## 2022-06-06 LAB — CBC WITH DIFFERENTIAL/PLATELET
Abs Immature Granulocytes: 0 10*3/uL (ref 0.00–0.07)
Basophils Absolute: 0 10*3/uL (ref 0.0–0.1)
Basophils Relative: 1 %
Eosinophils Absolute: 0.1 10*3/uL (ref 0.0–0.5)
Eosinophils Relative: 2 %
HCT: 47.1 % (ref 39.0–52.0)
Hemoglobin: 15.1 g/dL (ref 13.0–17.0)
Immature Granulocytes: 0 %
Lymphocytes Relative: 44 %
Lymphs Abs: 1.8 10*3/uL (ref 0.7–4.0)
MCH: 30.8 pg (ref 26.0–34.0)
MCHC: 32.1 g/dL (ref 30.0–36.0)
MCV: 95.9 fL (ref 80.0–100.0)
Monocytes Absolute: 0.5 10*3/uL (ref 0.1–1.0)
Monocytes Relative: 11 %
Neutro Abs: 1.7 10*3/uL (ref 1.7–7.7)
Neutrophils Relative %: 42 %
Platelets: 210 10*3/uL (ref 150–400)
RBC: 4.91 MIL/uL (ref 4.22–5.81)
RDW: 12.7 % (ref 11.5–15.5)
WBC: 4.1 10*3/uL (ref 4.0–10.5)
nRBC: 0 % (ref 0.0–0.2)

## 2022-06-06 LAB — TROPONIN I (HIGH SENSITIVITY)
Troponin I (High Sensitivity): 3 ng/L (ref <18)
Troponin I (High Sensitivity): 3 ng/L (ref <18)

## 2022-06-06 LAB — ETHANOL: Alcohol, Ethyl (B): <10 mg/dL (ref <10)

## 2022-06-06 LAB — ACETAMINOPHEN LEVEL: Acetaminophen (Tylenol), Serum: 14 ug/mL (ref 10–30)

## 2022-06-06 LAB — CBG MONITORING, ED: Glucose-Capillary: 87 mg/dL (ref 70–99)

## 2022-06-06 MED ORDER — LORAZEPAM 1 MG PO TABS: 1.0000 mg | ORAL_TABLET | ORAL | Status: DC | PRN

## 2022-06-06 MED ORDER — ZIPRASIDONE MESYLATE 20 MG IM SOLR
10.0000 mg | Freq: Once | INTRAMUSCULAR | Status: DC
  Filled 2022-06-06: qty 20

## 2022-06-06 MED ORDER — DIAZEPAM 5 MG/ML IJ SOLN
10.0000 mg | Freq: Once | INTRAMUSCULAR | Status: AC
  Administered 2022-06-06: 10 mg via INTRAVENOUS
  Filled 2022-06-06: qty 2

## 2022-06-06 MED ORDER — LACTATED RINGERS IV BOLUS
1000.0000 mL | Freq: Once | INTRAVENOUS | Status: AC
  Administered 2022-06-06: 1000 mL via INTRAVENOUS

## 2022-06-06 MED ORDER — ALUM & MAG HYDROXIDE-SIMETH 200-200-20 MG/5ML PO SUSP: 30.0000 mL | Freq: Four times a day (QID) | ORAL | Status: DC | PRN

## 2022-06-06 MED ORDER — STERILE WATER FOR INJECTION IJ SOLN
INTRAMUSCULAR | Status: AC
  Filled 2022-06-06: qty 10

## 2022-06-06 MED ORDER — NICOTINE 21 MG/24HR TD PT24
21.0000 mg | MEDICATED_PATCH | Freq: Every day | TRANSDERMAL | Status: DC
  Filled 2022-06-06: qty 1

## 2022-06-06 MED ORDER — HALOPERIDOL LACTATE 5 MG/ML IJ SOLN
5.0000 mg | Freq: Once | INTRAMUSCULAR | Status: AC
  Administered 2022-06-06: 5 mg via INTRAVENOUS
  Filled 2022-06-06: qty 1

## 2022-06-06 MED ORDER — ACETAMINOPHEN 325 MG PO TABS: 650.0000 mg | ORAL_TABLET | ORAL | Status: DC | PRN

## 2022-06-06 MED ORDER — OLANZAPINE 5 MG PO TBDP: 5.0000 mg | ORAL_TABLET | Freq: Three times a day (TID) | ORAL | Status: DC | PRN

## 2022-06-06 MED ORDER — ONDANSETRON HCL 4 MG PO TABS: 4.0000 mg | ORAL_TABLET | Freq: Three times a day (TID) | ORAL | Status: DC | PRN
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
## 2022-06-06 NOTE — ED Notes (Signed)
EXP:06/13/22

## 2022-06-06 NOTE — ED Notes (Signed)
CBG checked by this tech

## 2022-06-06 NOTE — ED Notes (Signed)
Patient asked RN if she could contact his mother regarding his car; With permission RN spoke with mother and verified all belongings was taken by family and car has been picked up; Information was relayed to patient and he is now at the RN station speaking with mother.Patient is now calm at this time and waiting for [facility]

## 2022-06-06 NOTE — ED Provider Notes (Signed)
[place] EMERGENCY DEPARTMENT AT [facility] Provider Note   CSN: [id number] Arrival date & time: 06/06/22  1001     History  Chief Complaint  Patient presents with   AMS   Hyperventilating    Allen Jensen is a 27 y.o. male.  HPI 27 year old male presents with acute altered mental status.  History is very limited as the patient is altered and the friend the dropped him off at triage left after registering him.  Patient is unable to give me much history and he will alternate between being asleep or at least having his eyes closed and then sitting up and yelling.  He questions where he is and then will call back down and lay down.  At 1 point he had a little bit of drool coming out of his mouth.  After the initial workup and evaluation his mom arrived and noted that the cousin's who dropped him off and called her and stated that he was irate after going to the ATM this morning an hour or so prior to arrival.  It is unclear what made him upset.  He does use marijuana but no other illicit drugs.  Mom does note that he took some Tylenol for a headache and has been complaining of headaches for months which she thinks is related to his eyes.  He was also telling the cousin about chest pain and arm pain.  Home Medications Prior to Admission medications   Medication Sig Start Date End Date Taking? Authorizing Provider  amoxicillin (AMOXIL) 500 MG capsule Take 1 capsule (500 mg total) by mouth 3 (three) times daily. Patient not taking: Reported on 06/06/2022 06/23/18   Jacalyn Lefevre, MD  carbamide peroxide (DEBROX) 6.5 % OTIC solution Place 5 drops into both ears 2 (two) times daily. Patient not taking: Reported on 06/06/2022 06/23/18   Jacalyn Lefevre, MD  fluticasone [facility]) 50 MCG/ACT nasal spray Place 2 sprays into both nostrils daily. Patient not taking: Reported on 06/06/2022 07/09/18   Belinda Fisher, PA-C  naproxen sodium (ANAPROX DS) 550 MG tablet Take 1 tablet (550 mg total)  by mouth 2 (two) times daily with a meal. Patient not taking: Reported on 06/06/2022 08/21/21   Ellsworth Lennox, PA-C  neomycin-polymyxin-hydrocortisone (CORTISPORIN) OTIC solution Place 4 drops into the left ear 4 (four) times daily. Patient not taking: Reported on 06/06/2022 06/30/18   Bing Neighbors, NP  ondansetron (ZOFRAN) 4 MG tablet Take 1 tablet (4 mg total) by mouth every 8 (eight) hours as needed for nausea or vomiting. Patient not taking: Reported on 06/06/2022 08/21/21   Ellsworth Lennox, PA-C      Allergies    Other    Review of Systems   Review of Systems  Unable to perform ROS: Mental status change    Physical Exam Updated Vital Signs BP (!) 151/86   Pulse 92   Temp (!) 97.2 F (36.2 C) (Axillary)   Resp (!) 22   SpO2 100%  Physical Exam Vitals and nursing note reviewed.  Constitutional:      Appearance: He is well-developed.     Comments: Patient is somnolent and then will have a little bit of drooling but then will wake up and become agitated and sit straight up and try and get out of bed.  HENT:     Head: Normocephalic and atraumatic.  Eyes:     Comments: Pupils mildly dilated bilaterally, reactive  Cardiovascular:     Rate and Rhythm:  Normal rate and regular rhythm.     Heart sounds: Normal heart sounds.  Pulmonary:     Effort: Pulmonary effort is normal.     Breath sounds: Normal breath sounds.  Abdominal:     General: There is no distension.  Musculoskeletal:     Cervical back: No rigidity.  Skin:    General: Skin is warm and dry.  Neurological:     Mental Status: He is alert.     Comments: Patient knows his name but does not know where he is and seems acutely confused.  He is moving all 4 extremities equally.  He is able to speak in clear sentences.     ED Results / Procedures / Treatments   Labs (all labs ordered are listed, but only abnormal results are displayed) Labs Reviewed  RAPID URINE DRUG SCREEN, HOSP PERFORMED - Abnormal; Notable for the  following components:      Result Value   Benzodiazepines POSITIVE (*)    Tetrahydrocannabinol POSITIVE (*)    All other components within normal limits  SALICYLATE LEVEL - Abnormal; Notable for the following components:   Salicylate Lvl <7.0 (*)    All other components within normal limits  COMPREHENSIVE METABOLIC PANEL - Abnormal; Notable for the following components:   CO2 20 (*)    Glucose, Bld 106 (*)    Creatinine, Ser 1.32 (*)    Anion gap 16 (*)    All other components within normal limits  I-STAT CHEM 8, ED - Abnormal; Notable for the following components:   Glucose, Bld 112 (*)    Calcium, Ion 1.06 (*)    All other components within normal limits  URINALYSIS, W/ REFLEX TO CULTURE (INFECTION SUSPECTED)  ETHANOL  CBC WITH DIFFERENTIAL/PLATELET  ACETAMINOPHEN LEVEL  CBG MONITORING, ED  TROPONIN I (HIGH SENSITIVITY)  TROPONIN I (HIGH SENSITIVITY)    EKG EKG Interpretation  Date/Time:  Friday June 06 2022 10:18:23 EDT Ventricular Rate:  75 PR Interval:  187 QRS Duration: 99 QT Interval:  390 QTC Calculation: 436 R Axis:   88 Text Interpretation: Sinus rhythm RSR' in V1 or V2, probably normal variant  diffuse ST elevations probably early repol, similar to 2019 Confirmed by Pricilla Loveless [phone]) on 06/06/2022 10:32:10 AM  Radiology CT Head Wo Contrast  Result Date: 06/06/2022 CLINICAL DATA:  Provided history: Mental status change, unknown cause. EXAM: CT HEAD WITHOUT CONTRAST TECHNIQUE: Contiguous axial images were obtained from the base of the skull through the vertex without intravenous contrast. RADIATION DOSE REDUCTION: This exam was performed according to the departmental dose-optimization program which includes automated exposure control, adjustment of the mA and/or kV according to patient size and/or use of iterative reconstruction technique. COMPARISON:  No pertinent prior exams available for comparison. FINDINGS: Brain: Cerebral volume is normal. There is no  acute intracranial hemorrhage. No demarcated cortical infarct. No extra-axial fluid collection. No evidence of an intracranial mass. No midline shift. Vascular: No hyperdense vessel. Skull: No fracture or aggressive osseous lesion. Sinuses/Orbits: No mass or acute finding within the imaged orbits. No significant paranasal sinus disease at the imaged levels. IMPRESSION: No evidence of an acute intracranial abnormality. Electronically Signed   By: Jackey Loge D.O.   On: 06/06/2022 11:48   DG Chest Portable 1 View  Result Date: 06/06/2022 CLINICAL DATA:  Chest pain EXAM: PORTABLE CHEST 1 VIEW COMPARISON:  X-ray 11/01/2018 FINDINGS: No consolidation, pneumothorax or effusion. Normal cardiopericardial silhouette. Overlapping cardiac leads IMPRESSION: No acute cardiopulmonary disease Electronically Signed  By: Karen Kays M.D.   On: 06/06/2022 10:44    Procedures .Critical Care  Performed by: Pricilla Loveless, MD Authorized by: Pricilla Loveless, MD   Critical care provider statement:    Critical care time (minutes):  30   Critical care time was exclusive of:  Separately billable procedures and treating other patients   Critical care was necessary to treat or prevent imminent or life-threatening deterioration of the following conditions:  CNS failure or compromise   Critical care was time spent personally by me on the following activities:  Development of treatment plan with patient or surrogate, discussions with consultants, evaluation of patient's response to treatment, examination of patient, ordering and review of laboratory studies, ordering and review of radiographic studies, ordering and performing treatments and interventions, pulse oximetry, re-evaluation of patient's condition and review of old charts     Medications Ordered in ED Medications  OLANZapine zydis (ZYPREXA) disintegrating tablet 5 mg (has no administration in time range)    And  LORazepam (ATIVAN) tablet 1 mg (has no  administration in time range)  acetaminophen (TYLENOL) tablet 650 mg (has no administration in time range)  ondansetron (ZOFRAN) tablet 4 mg (has no administration in time range)  alum & mag hydroxide-simeth (MAALOX/MYLANTA) 200-200-20 MG/5ML suspension 30 mL (has no administration in time range)  nicotine (NICODERM CQ - dosed in mg/24 hours) patch 21 mg (0 mg Transdermal Hold 06/06/22 1405)  lactated ringers bolus 1,000 mL (0 mLs Intravenous Stopped 06/06/22 1153)  diazepam (VALIUM) injection 10 mg (10 mg Intravenous Given 06/06/22 1016)  haloperidol lactate (HALDOL) injection 5 mg (5 mg Intravenous Given 06/06/22 1230)    ED Course/ Medical Decision Making/ A&P Clinical Course as of 06/06/22 1612  Fri Jun 06, 2022  1226 Is much more alert but now agitated.  He is screaming for [place] to take the telemetry leads off of him.  However I do not think he is in his right state of mind, does not know what month or day it is, and I do not think is able to make medical decision for himself.  Workup is still pending.  Will give IV Haldol as he is acutely altered and agitated.  Mom agrees he is altered and not himself.  At this point my main concern with a negative head CT and negative workup otherwise is substance use versus psychiatric illness. [SG]  1231 Will IVC patient as he is acutely altered and agitated [SG]    Clinical Course User Index [SG] Pricilla Loveless, MD                             Medical Decision Making Amount and/or Complexity of Data Reviewed Labs: ordered. Radiology: ordered and independent interpretation performed.    Details: No pneumothorax ECG/medicine tests: ordered and independent interpretation performed.    Details: Early repolarization  Risk OTC drugs. Prescription drug management.   Patient presents with acute altered mental status.  After a workup including CT head, blood work, urine, I do not find an obvious emergent medical condition.  I think this is probably  psychiatric.  The benzos in his urine are probably related to the Valium he was given here.  Could be substance abuse not picked up on the UDS but at this point, I think he will need psychiatric consultation.  Appears medically stable for psychiatric disposition.  He did have some chest pain but his troponins are negative x 2 with  an unremarkable EKG.  He has been involuntarily committed.        Final Clinical Impression(s) / ED Diagnoses Final diagnoses:  None    Rx / DC Orders ED Discharge Orders     None         Pricilla Loveless, MD 06/06/22 [phone]

## 2022-06-06 NOTE — ED Notes (Signed)
Patient woke up asking sitter for clothes and why he was still here; Sitter explained a few time why patient could not get his belongings and he was IVC'd; pt became increasingly agitated and [employer] and security called to unit for support; pt attempted to walk out the unit but was redirectable when advised of IVC; pt was verbally aggressive and up set with the situation of being here and not being seen yet by Psy. RN explained that NP attempted to assess him earlier however he had been given Haldol and was sleeping; RN notified EDP for emergency med order due to increased agitation-Monique,RN

## 2022-06-06 NOTE — ED Notes (Signed)
TTS in process 

## 2022-06-06 NOTE — ED Notes (Signed)
Patient calmed down and apologized for aggressive out burt and agreed to stay calm. RN will hold IM med for now; Psy NP/counselors was contacted to see ETA on patient's assessment-Monique,RN

## 2022-06-06 NOTE — ED Notes (Signed)
Pt arrives to ED with altered mental status. Pt has multiple complaints yelling, "I can't move my arms!", " Why am I here?!", "Take this shit off of me!" Pt admits to marijuana use.

## 2022-06-06 NOTE — ED Notes (Signed)
Report received assumed care at this time.

## 2022-06-06 NOTE — ED Triage Notes (Signed)
Pt came in via POV when his friend dropped him off at the door & he was altered, hyperventilating, stating he cannot feel his arms, yelling "I have to take care of business & my family." Alert to self was unable to stand. Staff lifting him from car to chair & chair to stretcher. Once ion the room he had a outburst of strength & tensing on the bed then began foaming at the mouth. EDP at bedside.

## 2022-06-06 NOTE — ED Notes (Signed)
IVC paperwork completed, Original in red magistrate folder, faxed to [facility] & [facility] 1 copy labeled and in medrec drawer, 3 copies on purple clip board in purple zone with pt

## 2022-06-06 NOTE — Consult Note (Signed)
This provider attempted to assess pt. As per nursing, pt is sleeping and not able to currently engage. Per chart review pt was administered 5 mg haldol. Nursing to notify provider when pt awake.   Kenney Houseman Tj Kitchings NP

## 2022-06-06 NOTE — ED Notes (Signed)
Pt becoming more aggressive with family members and staff at this time. MD notified of pt status. Security called and outside of pt's room.

## 2023-01-10 ENCOUNTER — Emergency Department (HOSPITAL_COMMUNITY)
Admission: EM | Admit: 2023-01-10 | Discharge: 2023-01-10 | Disposition: A | Discharge status: Home / Self Care | Payer: Self-pay | Attending: Emergency Medicine | Admitting: Emergency Medicine

## 2023-01-10 ENCOUNTER — Other Ambulatory Visit: Payer: Self-pay

## 2023-01-10 ENCOUNTER — Encounter (HOSPITAL_COMMUNITY): Payer: Self-pay

## 2023-01-10 DIAGNOSIS — R197 Diarrhea, unspecified: Secondary | ICD-10-CM | POA: Insufficient documentation

## 2023-01-10 DIAGNOSIS — Z1152 Encounter for screening for COVID-19: Secondary | ICD-10-CM | POA: Insufficient documentation

## 2023-01-10 DIAGNOSIS — R112 Nausea with vomiting, unspecified: Secondary | ICD-10-CM | POA: Insufficient documentation

## 2023-01-10 LAB — COMPREHENSIVE METABOLIC PANEL
ALT: 17 U/L (ref 0–44)
AST: 22 U/L (ref 15–41)
Albumin: 5.3 g/dL — ABNORMAL HIGH (ref 3.5–5.0)
Alkaline Phosphatase: 53 U/L (ref 38–126)
Anion gap: 12 (ref 5–15)
BUN: 27 mg/dL — ABNORMAL HIGH (ref 6–20)
CO2: 25 mmol/L (ref 22–32)
Calcium: 10.4 mg/dL — ABNORMAL HIGH (ref 8.9–10.3)
Chloride: 102 mmol/L (ref 98–111)
Creatinine, Ser: 1.23 mg/dL (ref 0.61–1.24)
GFR, Estimated: >60 mL/min (ref >60)
Glucose, Bld: 110 mg/dL — ABNORMAL HIGH (ref 70–99)
Potassium: 3.7 mmol/L (ref 3.5–5.1)
Sodium: 139 mmol/L (ref 135–145)
Total Bilirubin: 0.8 mg/dL (ref <1.2)
Total Protein: 9.6 g/dL — ABNORMAL HIGH (ref 6.5–8.1)

## 2023-01-10 LAB — RESP PANEL BY RT-PCR (RSV, FLU A&B, COVID)  RVPGX2
Influenza A by PCR: NEGATIVE
Influenza B by PCR: NEGATIVE
Resp Syncytial Virus by PCR: NEGATIVE
SARS Coronavirus 2 by RT PCR: NEGATIVE

## 2023-01-10 LAB — CBC WITH DIFFERENTIAL/PLATELET
Abs Immature Granulocytes: 0.01 10*3/uL (ref 0.00–0.07)
Basophils Absolute: 0 10*3/uL (ref 0.0–0.1)
Basophils Relative: 0 %
Eosinophils Absolute: 0 10*3/uL (ref 0.0–0.5)
Eosinophils Relative: 0 %
HCT: 45.1 % (ref 39.0–52.0)
Hemoglobin: 15.6 g/dL (ref 13.0–17.0)
Immature Granulocytes: 0 %
Lymphocytes Relative: 14 %
Lymphs Abs: 1 10*3/uL (ref 0.7–4.0)
MCH: 31.5 pg (ref 26.0–34.0)
MCHC: 34.6 g/dL (ref 30.0–36.0)
MCV: 90.9 fL (ref 80.0–100.0)
Monocytes Absolute: 0.7 10*3/uL (ref 0.1–1.0)
Monocytes Relative: 10 %
Neutro Abs: 5.8 10*3/uL (ref 1.7–7.7)
Neutrophils Relative %: 76 %
Platelets: 229 10*3/uL (ref 150–400)
RBC: 4.96 MIL/uL (ref 4.22–5.81)
RDW: 13.2 % (ref 11.5–15.5)
WBC: 7.6 10*3/uL (ref 4.0–10.5)
nRBC: 0 % (ref 0.0–0.2)

## 2023-01-10 LAB — LIPASE, BLOOD: Lipase: 34 U/L (ref 11–51)

## 2023-01-10 MED ORDER — KETOROLAC TROMETHAMINE 30 MG/ML IJ SOLN
30.0000 mg | Freq: Once | INTRAMUSCULAR | Status: AC
  Administered 2023-01-10: 30 mg via INTRAVENOUS
  Filled 2023-01-10: qty 1

## 2023-01-10 MED ORDER — ONDANSETRON 4 MG PO TBDP: 4.0000 mg | ORAL_TABLET | Freq: Three times a day (TID) | ORAL | 0 refills | Status: AC | PRN

## 2023-01-10 MED ORDER — ONDANSETRON HCL 4 MG/2ML IJ SOLN
4.0000 mg | Freq: Once | INTRAMUSCULAR | Status: AC
  Administered 2023-01-10: 4 mg via INTRAVENOUS
  Filled 2023-01-10: qty 2

## 2023-01-10 MED ORDER — ONDANSETRON 4 MG PO TBDP
4.0000 mg | ORAL_TABLET | Freq: Once | ORAL | Status: AC
  Administered 2023-01-10: 4 mg via ORAL
  Filled 2023-01-10: qty 1

## 2023-01-10 NOTE — ED Provider Notes (Signed)
[facility]-EMERGENCY DEPT [facility] Emergency Department Provider Note MRN:  [id number]  Arrival date & time: 01/10/23     Chief Complaint   Emesis and Diarrhea   History of Present Illness   Allen Jensen is a 27 y.o. year-old male presents to the ED with chief complaint of generalized body aches, nausea, vomiting, and diarrhea.  Onset was 3 days ago. States his temp has been 99.  Has tried ibuprofen without relief.  No other successful treatments PTA.  Denies any focal abdominal pain.    History provided by patient.   Review of Systems  Pertinent positive and negative review of systems noted in HPI.    Physical Exam   Vitals:   01/10/23 0415 01/10/23 0430  BP: 130/72 121/75  Pulse: (!) 51 (!) 50  Resp:  18  Temp:  98.1 F (36.7 C)  SpO2: 100% 98%    CONSTITUTIONAL:  non toxic-appearing, NAD NEURO:  Alert and oriented x 3, CN 3-12 grossly intact EYES:  eyes equal and reactive ENT/NECK:  Supple, no stridor  CARDIO:  normal rate, appears well-perfused  PULM:  No respiratory distress, no wheezing GI/GU:  non-distended, no focal abdominal tenderness MSK/SPINE:  No gross deformities, no edema, moves all extremities  SKIN:  no rash, atraumatic   *Additional and/or pertinent findings included in MDM below  Diagnostic and Interventional Summary    EKG Interpretation Date/Time:    Ventricular Rate:    PR Interval:    QRS Duration:    QT Interval:    QTC Calculation:   R Axis:      Text Interpretation:         Labs Reviewed  COMPREHENSIVE METABOLIC PANEL - Abnormal; Notable for the following components:      Result Value   Glucose, Bld 110 (*)    BUN 27 (*)    Calcium 10.4 (*)    Total Protein 9.6 (*)    Albumin 5.3 (*)    All other components within normal limits  RESP PANEL BY RT-PCR (RSV, FLU A&B, COVID)  [id number]  CBC WITH DIFFERENTIAL/PLATELET  LIPASE, BLOOD    No orders to display    Medications  ondansetron (ZOFRAN-ODT) disintegrating tablet  4 mg (4 mg Oral Given 01/10/23 0319)  ketorolac (TORADOL) 30 MG/ML injection 30 mg (30 mg Intravenous Given 01/10/23 0323)  ondansetron (ZOFRAN) injection 4 mg (4 mg Intravenous Given 01/10/23 0358)     Procedures  /  Critical Care Procedures  ED Course and Medical Decision Making  I have reviewed the triage vital signs, the nursing notes, and pertinent available records from the EMR.  Social Determinants Affecting Complexity of Care: Patient has no clinically significant social determinants affecting this chief complaint..   ED Course:    Medical Decision Making Patient here with nausea, vomiting, and diarrhea for the past 3 days.  He reports associated generalized abdominal cramps, but does not have any focal tenderness on my exam.  Labs ordered in triage are reassuring.  Patient is feeling improved after treatment in the ED.  He is tolerating oral intake.  Will prescribe Zofran for home.  Given that he does not have any focal abdominal tenderness and labs are reassuring, doubt surgical or acute abdomen.  I do not think that he requires any advanced imaging at this time.  Return precautions discussed.  Amount and/or Complexity of Data Reviewed Labs: ordered.  Risk Prescription drug management.         Consultants: No consultations were  needed in caring for this patient.   Treatment and Plan: Emergency department workup does not suggest an emergent condition requiring admission or immediate intervention beyond  what has been performed at this time. The patient is safe for discharge and has  been instructed to return immediately for worsening symptoms, change in  symptoms or any other concerns    Final Clinical Impressions(s) / ED Diagnoses     ICD-10-CM   1. Nausea vomiting and diarrhea  R11.2    R19.7       ED Discharge Orders          Ordered    ondansetron (ZOFRAN-ODT) 4 MG disintegrating tablet  Every 8 hours PRN        01/10/23 0440               Discharge Instructions Discussed with and Provided to Patient:   Discharge Instructions   None      Roxy Horseman, PA-C 01/10/23 0445    Gilda Crease, MD 01/10/23 [phone]

## 2023-01-10 NOTE — ED Triage Notes (Signed)
Pt reports with n/v/d since Wednesday. Pt states that he was seen recently but is not getting any better. Pt reports taking Motrin for his fevers.

## 2023-01-12 ENCOUNTER — Encounter (HOSPITAL_COMMUNITY): Payer: Self-pay | Admitting: Radiology

## 2023-01-12 ENCOUNTER — Emergency Department (HOSPITAL_COMMUNITY)
Admission: EM | Admit: 2023-01-12 | Discharge: 2023-01-12 | Disposition: A | Discharge status: Home / Self Care | Payer: Self-pay | Attending: Emergency Medicine | Admitting: Emergency Medicine

## 2023-01-12 ENCOUNTER — Other Ambulatory Visit: Payer: Self-pay

## 2023-01-12 ENCOUNTER — Emergency Department (HOSPITAL_COMMUNITY): Payer: Self-pay

## 2023-01-12 DIAGNOSIS — N179 Acute kidney failure, unspecified: Secondary | ICD-10-CM | POA: Insufficient documentation

## 2023-01-12 DIAGNOSIS — R112 Nausea with vomiting, unspecified: Secondary | ICD-10-CM | POA: Insufficient documentation

## 2023-01-12 DIAGNOSIS — J45909 Unspecified asthma, uncomplicated: Secondary | ICD-10-CM | POA: Insufficient documentation

## 2023-01-12 DIAGNOSIS — M791 Myalgia, unspecified site: Secondary | ICD-10-CM | POA: Insufficient documentation

## 2023-01-12 DIAGNOSIS — J111 Influenza due to unidentified influenza virus with other respiratory manifestations: Secondary | ICD-10-CM

## 2023-01-12 DIAGNOSIS — R197 Diarrhea, unspecified: Secondary | ICD-10-CM | POA: Insufficient documentation

## 2023-01-12 LAB — COMPREHENSIVE METABOLIC PANEL
ALT: 20 U/L (ref 0–44)
AST: 24 U/L (ref 15–41)
Albumin: 6 g/dL — ABNORMAL HIGH (ref 3.5–5.0)
Alkaline Phosphatase: 61 U/L (ref 38–126)
Anion gap: 14 (ref 5–15)
BUN: 49 mg/dL — ABNORMAL HIGH (ref 6–20)
CO2: 23 mmol/L (ref 22–32)
Calcium: 10.2 mg/dL (ref 8.9–10.3)
Chloride: 97 mmol/L — ABNORMAL LOW (ref 98–111)
Creatinine, Ser: 1.63 mg/dL — ABNORMAL HIGH (ref 0.61–1.24)
GFR, Estimated: 59 mL/min — ABNORMAL LOW (ref >60)
Glucose, Bld: 107 mg/dL — ABNORMAL HIGH (ref 70–99)
Potassium: 3.4 mmol/L — ABNORMAL LOW (ref 3.5–5.1)
Sodium: 134 mmol/L — ABNORMAL LOW (ref 135–145)
Total Bilirubin: 1.2 mg/dL — ABNORMAL HIGH (ref <1.2)
Total Protein: 10.3 g/dL — ABNORMAL HIGH (ref 6.5–8.1)

## 2023-01-12 LAB — CBC WITH DIFFERENTIAL/PLATELET
Abs Immature Granulocytes: 0.01 10*3/uL (ref 0.00–0.07)
Basophils Absolute: 0 10*3/uL (ref 0.0–0.1)
Basophils Relative: 1 %
Eosinophils Absolute: 0 10*3/uL (ref 0.0–0.5)
Eosinophils Relative: 0 %
HCT: 53.8 % — ABNORMAL HIGH (ref 39.0–52.0)
Hemoglobin: 18.1 g/dL — ABNORMAL HIGH (ref 13.0–17.0)
Immature Granulocytes: 0 %
Lymphocytes Relative: 19 %
Lymphs Abs: 1.5 10*3/uL (ref 0.7–4.0)
MCH: 30.4 pg (ref 26.0–34.0)
MCHC: 33.6 g/dL (ref 30.0–36.0)
MCV: 90.3 fL (ref 80.0–100.0)
Monocytes Absolute: 0.9 10*3/uL (ref 0.1–1.0)
Monocytes Relative: 12 %
Neutro Abs: 5.1 10*3/uL (ref 1.7–7.7)
Neutrophils Relative %: 68 %
Platelets: 263 10*3/uL (ref 150–400)
RBC: 5.96 MIL/uL — ABNORMAL HIGH (ref 4.22–5.81)
RDW: 12.8 % (ref 11.5–15.5)
WBC: 7.5 10*3/uL (ref 4.0–10.5)
nRBC: 0 % (ref 0.0–0.2)

## 2023-01-12 LAB — HIV ANTIBODY (ROUTINE TESTING W REFLEX): HIV Screen 4th Generation wRfx: NONREACTIVE

## 2023-01-12 LAB — LIPASE, BLOOD: Lipase: 28 U/L (ref 11–51)

## 2023-01-12 MED ORDER — SODIUM CHLORIDE 0.9 % IV SOLN
12.5000 mg | Freq: Four times a day (QID) | INTRAVENOUS | Status: DC | PRN
  Administered 2023-01-12: 12.5 mg via INTRAVENOUS
  Filled 2023-01-12: qty 12.5

## 2023-01-12 MED ORDER — SODIUM CHLORIDE 0.9 % IV BOLUS
1000.0000 mL | Freq: Once | INTRAVENOUS | Status: AC
  Administered 2023-01-12: 1000 mL via INTRAVENOUS

## 2023-01-12 MED ORDER — PROMETHAZINE HCL 25 MG PO TABS: 25.0000 mg | ORAL_TABLET | Freq: Four times a day (QID) | ORAL | 0 refills | Status: AC | PRN

## 2023-01-12 NOTE — ED Triage Notes (Signed)
Pt reports emesis, diarrhea, body aches, back pain and abdominal pain for the last several days. Pt reports he was seen recently and has been taking zofran and it has not helped.

## 2023-01-12 NOTE — ED Provider Notes (Signed)
[place] EMERGENCY DEPARTMENT AT [facility] Provider Note   CSN: [id number] Arrival date & time: 01/12/23  0102     History  Chief Complaint  Patient presents with   Emesis   Generalized Body Aches    Allen Jensen is a 27 y.o. male.  HPI     This is a 27 year old male who presents with ongoing nausea, vomiting, diarrhea, and bodyaches.  Was seen and evaluated on Saturday morning for the same.  States that he has had persistent nausea and vomiting despite Zofran at home.  He reports crampy abdominal pain.  Has had some diarrhea.  Reports chills without documented fevers.  No known sick contacts.  Does smoke marijuana up to twice per week but states he has never had issues with vomiting and marijuana use.  Denies any high risk behaviors for HIV.  Does report unintended weight loss.  Home Medications Prior to Admission medications   Medication Sig Start Date End Date Taking? Authorizing Provider  promethazine (PHENERGAN) 25 MG tablet Take 1 tablet (25 mg total) by mouth every 6 (six) hours as needed for nausea or vomiting. 01/12/23  Yes Allizon Woznick, Mayer Masker, MD  ondansetron (ZOFRAN-ODT) 4 MG disintegrating tablet Take 1 tablet (4 mg total) by mouth every 8 (eight) hours as needed for nausea or vomiting. 01/10/23   Roxy Horseman, PA-C      Allergies    Other    Review of Systems   Review of Systems  Constitutional:  Negative for fever.  Respiratory:  Negative for shortness of breath.   Cardiovascular:  Negative for chest pain.  Gastrointestinal:  Positive for abdominal pain, diarrhea, nausea and vomiting.  All other systems reviewed and are negative.   Physical Exam Updated Vital Signs BP 129/75   Pulse 60   Temp 98.1 F (36.7 C)   Resp 16   SpO2 99%  Physical Exam Vitals and nursing note reviewed.  Constitutional:      Appearance: He is well-developed. He is ill-appearing. He is not toxic-appearing.     Comments: Thin, cachectic appearing  HENT:      Head: Normocephalic and atraumatic.  Eyes:     Pupils: Pupils are equal, round, and reactive to light.  Cardiovascular:     Rate and Rhythm: Normal rate and regular rhythm.     Heart sounds: Normal heart sounds. No murmur heard. Pulmonary:     Effort: Pulmonary effort is normal. No respiratory distress.     Breath sounds: Normal breath sounds. No wheezing.  Abdominal:     General: Bowel sounds are normal.     Palpations: Abdomen is soft.     Tenderness: There is abdominal tenderness. There is no rebound.  Musculoskeletal:     Cervical back: Neck supple.  Lymphadenopathy:     Cervical: No cervical adenopathy.  Skin:    General: Skin is warm and dry.  Neurological:     Mental Status: He is alert and oriented to person, place, and time.  Psychiatric:        Mood and Affect: Mood normal.     ED Results / Procedures / Treatments   Labs (all labs ordered are listed, but only abnormal results are displayed) Labs Reviewed  CBC WITH DIFFERENTIAL/PLATELET - Abnormal; Notable for the following components:      Result Value   RBC 5.96 (*)    Hemoglobin 18.1 (*)    HCT 53.8 (*)    All other components within normal limits  COMPREHENSIVE METABOLIC PANEL - Abnormal; Notable for the following components:   Sodium 134 (*)    Potassium 3.4 (*)    Chloride 97 (*)    Glucose, Bld 107 (*)    BUN 49 (*)    Creatinine, Ser 1.63 (*)    Total Protein 10.3 (*)    Albumin 6.0 (*)    Total Bilirubin 1.2 (*)    GFR, Estimated 59 (*)    All other components within normal limits  LIPASE, BLOOD  HIV ANTIBODY (ROUTINE TESTING W REFLEX)    EKG None  Radiology CT ABDOMEN PELVIS WO CONTRAST  Result Date: 01/12/2023 CLINICAL DATA:  Bowel obstruction suspected EXAM: CT ABDOMEN AND PELVIS WITHOUT CONTRAST TECHNIQUE: Multidetector CT imaging of the abdomen and pelvis was performed following the standard protocol without IV contrast. RADIATION DOSE REDUCTION: This exam was performed according  to the departmental dose-optimization program which includes automated exposure control, adjustment of the mA and/or kV according to patient size and/or use of iterative reconstruction technique. COMPARISON:  None Available. FINDINGS: Lower chest:  Negative. Hepatobiliary: No focal liver abnormality.No evidence of biliary obstruction or stone. Pancreas: Unremarkable. Spleen: Unremarkable. Adrenals/Urinary Tract: Negative adrenals. No hydronephrosis or stone. Unremarkable bladder. Stomach/Bowel: No obstruction. No visible bowel inflammation, including in the right lower quadrant. Vascular/Lymphatic: No acute vascular abnormality. No mass or adenopathy. Reproductive:No pathologic findings. Other: No ascites or pneumoperitoneum. Musculoskeletal: No acute abnormalities. IMPRESSION: No explanation for symptoms. No bowel obstruction or visible inflammation. Electronically Signed   By: Tiburcio Pea M.D.   On: 01/12/2023 06:56    Procedures Procedures    Medications Ordered in ED Medications  sodium chloride 0.9 % bolus 1,000 mL (0 mLs Intravenous Stopped 01/12/23 1610)    ED Course/ Medical Decision Making/ A&P Clinical Course as of 01/12/23 2255  Mon Jan 12, 2023  0630 Creatinine(!): 1.63 [CH]    Clinical Course User Index [CH] Leviathan Macera, Mayer Masker, MD                                 Medical Decision Making Amount and/or Complexity of Data Reviewed Labs: ordered. Decision-making details documented in ED Course. Radiology: ordered.  Risk Prescription drug management.   This patient presents to the ED for concern of nausea and vomiting, this involves an extensive number of treatment options, and is a complaint that carries with it a high risk of complications and morbidity.  I considered the following differential and admission for this acute, potentially life threatening condition.  The differential diagnosis includes gastritis, gastroenteritis, pancreatitis, cholecystitis, SBO  MDM:     This is a 27 year old male who presents with nausea, vomiting.  Onset and persistent.  Was seen and evaluated 2 days ago and had relatively unremarkable lab work.  He does appear malnourished in general but is otherwise nontoxic and vital signs are reassuring.  Labs notable for AKI.  Patient was given fluids.  No transaminitis or elevated lipase.  CT scan obtained and does not show any obvious pathology.  Patient does report some improvement.  He denies any risk factors for HIV but given persistent viral symptoms and overall malnourished state, HIV testing was sent and is pending.  I advised the patient of this.  Will discharge with Phenergan.  (Labs, imaging, consults)  Labs: I Ordered, and personally interpreted labs.  The pertinent results include: CBC, CMP, lipase  Imaging Studies ordered: I ordered imaging studies including CT I  independently visualized and interpreted imaging. I agree with the radiologist interpretation  Additional history obtained from chart review.  External records from outside source obtained and reviewed including prior evaluations  Cardiac Monitoring: The patient was maintained on a cardiac monitor.  If on the cardiac monitor, I personally viewed and interpreted the cardiac monitored which showed an underlying rhythm of: Sinus  Reevaluation: After the interventions noted above, I reevaluated the patient and found that they have :improved  Social Determinants of Health:  lives independently  Disposition: Discharge  Co morbidities that complicate the patient evaluation  Past Medical History:  Diagnosis Date   Asthma    Bronchitis    Color blind    Heart murmur      Medicines Meds ordered this encounter  Medications   sodium chloride 0.9 % bolus 1,000 mL   DISCONTD: promethazine (PHENERGAN) 12.5 mg in sodium chloride 0.9 % 50 mL IVPB   promethazine (PHENERGAN) 25 MG tablet    Sig: Take 1 tablet (25 mg total) by mouth every 6 (six) hours as needed  for nausea or vomiting.    Dispense:  30 tablet    Refill:  0    I have reviewed the patients home medicines and have made adjustments as needed  Problem List / ED Course: Problem List Items Addressed This Visit   None Visit Diagnoses     Influenza-like illness    -  Primary   AKI (acute kidney injury) (HCC)                       Final Clinical Impression(s) / ED Diagnoses Final diagnoses:  Influenza-like illness  AKI (acute kidney injury) (HCC)    Rx / DC Orders ED Discharge Orders          Ordered    promethazine (PHENERGAN) 25 MG tablet  Every 6 hours PRN        01/12/23 0739              Shon Baton, MD 01/12/23 2257

## 2023-01-12 NOTE — Discharge Instructions (Signed)
You were seen today for ongoing nausea and vomiting.  You will be given a different nausea medication.  Make sure that you are staying hydrated with frequent liquids at home.  You do have additional viral testing pending including HIV.  You will be called if this is positive.
